# Patient Record
Sex: Male | Born: 1935
Health system: Southern US, Community
[De-identification: ages and names within clinical notes are randomized; demographics above are authoritative.]

## PROBLEM LIST (undated history)

## (undated) DIAGNOSIS — I639 Cerebral infarction, unspecified: Secondary | ICD-10-CM

## (undated) DIAGNOSIS — G629 Polyneuropathy, unspecified: Secondary | ICD-10-CM

## (undated) DIAGNOSIS — H9190 Unspecified hearing loss, unspecified ear: Secondary | ICD-10-CM

## (undated) DIAGNOSIS — K4090 Unilateral inguinal hernia, without obstruction or gangrene, not specified as recurrent: Secondary | ICD-10-CM

## (undated) DIAGNOSIS — E119 Type 2 diabetes mellitus without complications: Secondary | ICD-10-CM

## (undated) DIAGNOSIS — I10 Essential (primary) hypertension: Secondary | ICD-10-CM

## (undated) DIAGNOSIS — C4490 Unspecified malignant neoplasm of skin, unspecified: Secondary | ICD-10-CM

## (undated) DIAGNOSIS — I679 Cerebrovascular disease, unspecified: Secondary | ICD-10-CM

## (undated) DIAGNOSIS — N390 Urinary tract infection, site not specified: Secondary | ICD-10-CM

## (undated) DIAGNOSIS — G459 Transient cerebral ischemic attack, unspecified: Secondary | ICD-10-CM

## (undated) DIAGNOSIS — I4891 Unspecified atrial fibrillation: Secondary | ICD-10-CM

## (undated) HISTORY — DX: Unspecified malignant neoplasm of skin, unspecified: C44.90

## (undated) HISTORY — DX: Transient cerebral ischemic attack, unspecified: G45.9

## (undated) HISTORY — DX: Cerebral infarction, unspecified: I63.9

## (undated) HISTORY — DX: Urinary tract infection, site not specified: N39.0

## (undated) HISTORY — DX: Cerebrovascular disease, unspecified: I67.9

## (undated) HISTORY — DX: Unspecified hearing loss, unspecified ear: H91.90

## (undated) HISTORY — DX: Polyneuropathy, unspecified: G62.9

## (undated) HISTORY — DX: Unspecified atrial fibrillation: I48.91

## (undated) HISTORY — DX: Type 2 diabetes mellitus without complications: E11.9

## (undated) HISTORY — DX: Essential (primary) hypertension: I10

## (undated) HISTORY — PX: OTHER SURGICAL HISTORY: SHX169

## (undated) HISTORY — DX: Unilateral inguinal hernia, without obstruction or gangrene, not specified as recurrent: K40.90

---

## 1998-05-13 ENCOUNTER — Ambulatory Visit (HOSPITAL_COMMUNITY): Admission: RE | Admit: 1998-05-13 | Discharge: 1998-05-13 | Payer: Self-pay | Admitting: Internal Medicine

## 1998-10-05 ENCOUNTER — Encounter: Payer: Self-pay | Admitting: Internal Medicine

## 1998-10-05 ENCOUNTER — Ambulatory Visit (HOSPITAL_COMMUNITY): Admission: RE | Admit: 1998-10-05 | Discharge: 1998-10-05 | Payer: Self-pay | Admitting: Internal Medicine

## 2003-07-03 ENCOUNTER — Inpatient Hospital Stay (HOSPITAL_COMMUNITY): Admission: EM | Admit: 2003-07-03 | Discharge: 2003-07-09 | Payer: Self-pay | Admitting: Emergency Medicine

## 2003-07-04 ENCOUNTER — Encounter (INDEPENDENT_AMBULATORY_CARE_PROVIDER_SITE_OTHER): Payer: Self-pay | Admitting: *Deleted

## 2003-07-11 ENCOUNTER — Ambulatory Visit (HOSPITAL_COMMUNITY): Admission: RE | Admit: 2003-07-11 | Discharge: 2003-07-11 | Payer: Self-pay | Admitting: Neurology

## 2009-10-08 ENCOUNTER — Ambulatory Visit (HOSPITAL_COMMUNITY): Admission: RE | Admit: 2009-10-08 | Discharge: 2009-10-08 | Payer: Self-pay | Admitting: Gastroenterology

## 2010-04-06 ENCOUNTER — Inpatient Hospital Stay (HOSPITAL_COMMUNITY): Admission: EM | Admit: 2010-04-06 | Discharge: 2010-04-10 | Payer: Self-pay | Admitting: Emergency Medicine

## 2010-04-07 ENCOUNTER — Encounter (INDEPENDENT_AMBULATORY_CARE_PROVIDER_SITE_OTHER): Payer: Self-pay | Admitting: Neurology

## 2010-09-30 LAB — PROTIME-INR
INR: 1.69 — ABNORMAL HIGH (ref 0.00–1.49)
INR: 1.79 — ABNORMAL HIGH (ref 0.00–1.49)
INR: 2.78 — ABNORMAL HIGH (ref 0.00–1.49)
Prothrombin Time: 20.1 seconds — ABNORMAL HIGH (ref 11.6–15.2)
Prothrombin Time: 21 seconds — ABNORMAL HIGH (ref 11.6–15.2)
Prothrombin Time: 23.9 seconds — ABNORMAL HIGH (ref 11.6–15.2)

## 2010-09-30 LAB — COMPREHENSIVE METABOLIC PANEL
Alkaline Phosphatase: 64 U/L (ref 39–117)
BUN: 16 mg/dL (ref 6–23)
Glucose, Bld: 198 mg/dL — ABNORMAL HIGH (ref 70–99)
Potassium: 3.8 mEq/L (ref 3.5–5.1)
Total Protein: 6.8 g/dL (ref 6.0–8.3)

## 2010-09-30 LAB — URINALYSIS, ROUTINE W REFLEX MICROSCOPIC
Bilirubin Urine: NEGATIVE
Hgb urine dipstick: NEGATIVE
Ketones, ur: NEGATIVE mg/dL
Protein, ur: NEGATIVE mg/dL
Urobilinogen, UA: 0.2 mg/dL (ref 0.0–1.0)

## 2010-09-30 LAB — URINE CULTURE: Culture: NO GROWTH

## 2010-09-30 LAB — GLUCOSE, CAPILLARY
Glucose-Capillary: 144 mg/dL — ABNORMAL HIGH (ref 70–99)
Glucose-Capillary: 154 mg/dL — ABNORMAL HIGH (ref 70–99)
Glucose-Capillary: 164 mg/dL — ABNORMAL HIGH (ref 70–99)
Glucose-Capillary: 164 mg/dL — ABNORMAL HIGH (ref 70–99)
Glucose-Capillary: 189 mg/dL — ABNORMAL HIGH (ref 70–99)
Glucose-Capillary: 211 mg/dL — ABNORMAL HIGH (ref 70–99)
Glucose-Capillary: 243 mg/dL — ABNORMAL HIGH (ref 70–99)

## 2010-09-30 LAB — HEMOGLOBIN A1C
Hgb A1c MFr Bld: 6.9 % — ABNORMAL HIGH (ref <5.7)
Mean Plasma Glucose: 151 mg/dL — ABNORMAL HIGH (ref <117)

## 2010-09-30 LAB — LIPID PANEL: VLDL: 26 mg/dL (ref 0–40)

## 2010-09-30 LAB — CBC
HCT: 45.6 % (ref 39.0–52.0)
MCHC: 36 g/dL (ref 30.0–36.0)
RDW: 13.1 % (ref 11.5–15.5)

## 2010-09-30 LAB — DIFFERENTIAL
Basophils Absolute: 0 10*3/uL (ref 0.0–0.1)
Basophils Relative: 0 % (ref 0–1)
Monocytes Relative: 8 % (ref 3–12)
Neutro Abs: 5.3 10*3/uL (ref 1.7–7.7)
Neutrophils Relative %: 63 % (ref 43–77)

## 2010-09-30 LAB — CK TOTAL AND CKMB (NOT AT ARMC)
Relative Index: INVALID (ref 0.0–2.5)
Total CK: 50 U/L (ref 7–232)

## 2010-09-30 LAB — APTT: aPTT: 34 seconds (ref 24–37)

## 2010-10-07 ENCOUNTER — Inpatient Hospital Stay (HOSPITAL_COMMUNITY): Payer: Medicare Other

## 2010-10-07 ENCOUNTER — Inpatient Hospital Stay (HOSPITAL_COMMUNITY)
Admission: EM | Admit: 2010-10-07 | Discharge: 2010-10-08 | DRG: 069 | Disposition: A | Discharge status: Home / Self Care | Payer: Medicare Other | Attending: Internal Medicine | Admitting: Internal Medicine

## 2010-10-07 ENCOUNTER — Emergency Department (HOSPITAL_COMMUNITY): Payer: Medicare Other

## 2010-10-07 DIAGNOSIS — E119 Type 2 diabetes mellitus without complications: Secondary | ICD-10-CM | POA: Diagnosis present

## 2010-10-07 DIAGNOSIS — E785 Hyperlipidemia, unspecified: Secondary | ICD-10-CM | POA: Diagnosis present

## 2010-10-07 DIAGNOSIS — Z8673 Personal history of transient ischemic attack (TIA), and cerebral infarction without residual deficits: Secondary | ICD-10-CM

## 2010-10-07 DIAGNOSIS — Z79899 Other long term (current) drug therapy: Secondary | ICD-10-CM

## 2010-10-07 DIAGNOSIS — I10 Essential (primary) hypertension: Secondary | ICD-10-CM | POA: Diagnosis present

## 2010-10-07 DIAGNOSIS — I4891 Unspecified atrial fibrillation: Secondary | ICD-10-CM | POA: Diagnosis present

## 2010-10-07 DIAGNOSIS — Z7901 Long term (current) use of anticoagulants: Secondary | ICD-10-CM

## 2010-10-07 DIAGNOSIS — G459 Transient cerebral ischemic attack, unspecified: Principal | ICD-10-CM | POA: Diagnosis present

## 2010-10-07 LAB — DIFFERENTIAL
Basophils Absolute: 0 10*3/uL (ref 0.0–0.1)
Eosinophils Absolute: 0.3 10*3/uL (ref 0.0–0.7)
Lymphocytes Relative: 39 % (ref 12–46)
Lymphs Abs: 3.2 10*3/uL (ref 0.7–4.0)
Neutrophils Relative %: 48 % (ref 43–77)

## 2010-10-07 LAB — CARDIAC PANEL(CRET KIN+CKTOT+MB+TROPI)
CK, MB: 1 ng/mL (ref 0.3–4.0)
Relative Index: INVALID (ref 0.0–2.5)
Total CK: 49 U/L (ref 7–232)
Troponin I: <0.01 ng/mL (ref 0.00–0.06)

## 2010-10-07 LAB — CBC
HCT: 43.7 % (ref 39.0–52.0)
MCH: 30.6 pg (ref 26.0–34.0)
MCHC: 36.5 g/dL — ABNORMAL HIGH (ref 30.0–36.0)
MCV: 84.2 fL (ref 78.0–100.0)
Platelets: 178 10*3/uL (ref 150–400)
Platelets: 193 10*3/uL (ref 150–400)
RBC: 5.19 MIL/uL (ref 4.22–5.81)
RBC: 4.97 MIL/uL (ref 4.22–5.81)
RDW: 12.8 % (ref 11.5–15.5)
WBC: 8.2 10*3/uL (ref 4.0–10.5)

## 2010-10-07 LAB — COMPREHENSIVE METABOLIC PANEL
Albumin: 3.9 g/dL (ref 3.5–5.2)
BUN: 18 mg/dL (ref 6–23)
Calcium: 9.3 mg/dL (ref 8.4–10.5)
Creatinine, Ser: 1.03 mg/dL (ref 0.4–1.5)
Glucose, Bld: 202 mg/dL — ABNORMAL HIGH (ref 70–99)
Potassium: 3.7 mEq/L (ref 3.5–5.1)
Total Protein: 6.8 g/dL (ref 6.0–8.3)

## 2010-10-07 LAB — GLUCOSE, CAPILLARY: Glucose-Capillary: 138 mg/dL — ABNORMAL HIGH (ref 70–99)

## 2010-10-07 LAB — HEMOGLOBIN A1C: Hgb A1c MFr Bld: 6.8 % — ABNORMAL HIGH (ref <5.7)

## 2010-10-07 LAB — PROTIME-INR: INR: 1.97 — ABNORMAL HIGH (ref 0.00–1.49)

## 2010-10-07 LAB — POCT I-STAT, CHEM 8
Creatinine, Ser: 1.1 mg/dL (ref 0.4–1.5)
Glucose, Bld: 119 mg/dL — ABNORMAL HIGH (ref 70–99)
Hemoglobin: 15.6 g/dL (ref 13.0–17.0)
TCO2: 25 mmol/L (ref 0–100)

## 2010-10-07 LAB — APTT: aPTT: 36 seconds (ref 24–37)

## 2010-10-07 LAB — POCT CARDIAC MARKERS
CKMB, poc: <1 ng/mL — ABNORMAL LOW (ref 1.0–8.0)
Myoglobin, poc: 62.5 ng/mL (ref 12–200)
Troponin i, poc: <0.05 ng/mL (ref 0.00–0.09)

## 2010-10-08 LAB — GLUCOSE, CAPILLARY: Glucose-Capillary: 118 mg/dL — ABNORMAL HIGH (ref 70–99)

## 2010-10-08 LAB — LIPID PANEL
HDL: 40 mg/dL (ref >39)
Total CHOL/HDL Ratio: 2.5 RATIO
Triglycerides: 59 mg/dL (ref <150)

## 2010-10-14 NOTE — H&P (Signed)
Derrick Ross, Derrick Ross NO.:  [id number]  MEDICAL RECORD NO.:  [id number]           PATIENT TYPE:  E  LOCATION:  [facility]                         FACILITY:  [facility]  PHYSICIAN:  Michiel Cowboy, MDDATE OF BIRTH:  1930/08/08  DATE OF ADMISSION:  10/07/2010 DATE OF DISCHARGE:                             HISTORY & PHYSICAL   PRIMARY CARE PROVIDER:  [name], MD  CHIEF COMPLAINT:  Altered mental status.  HISTORY:  The patient is a 77 year old gentleman with past history of CVA, atrial fibrillation, currently on Coumadin and history of TIA in September.  The patient today went to bed at 12.  He woke up at 4 o'clock in the morning to use the bathroom.  His wife was awake already. She noticed that he was making some rumbling noises, she came up to see him.  He was talking to her, but did not seem to make much sense.  She started to ask him questions of general orientation, but he did not know what year it is, what date it was, only orientation questions.  The patient's wife became concerned and got him dressed up, walk him down the stairs, and drove him to the emergency department.  By the time he came to the ER, he was back to his baseline, was answering questions appropriately, he knew where he was.  He never had any ataxia.  His speech was somewhat slurred and also was somewhat confusing during this episode.  Overall, the episode lasted 30 minutes.  Currently, the patient is at baseline.  No other recent complaints of chest pain or shortness of breath.  No nausea, vomiting, constipation, or diarrhea.  PAST MEDICAL HISTORY:  Significant for: 1. Atrial fibrillation. 2. History of cerebrovascular accident 6 years ago/ 3. History of TIA in September. 4. Hypertension. 5. Most recently, he was diagnosed with diabetes, at this point diet     controlled, not on any medication.  SOCIAL HISTORY:  The patient lives with his wife.  He does not smoke or drink or use  drugs.  FAMILY HISTORY:  Noncontributory.  ALLERGIES:  None.  MEDICATIONS: 1. Atenolol 100 mg daily. 2. Clonidine 0.1 mg b.i.d. 3. Coumadin he takes 2.5 mg on all days except Tuesdays when he takes     5 mg. 4. Hydrochlorothiazide 25 mg daily. 5. Lotrel 5/40 mg daily. 6. Simvastatin 20 mg daily. 7. Potassium 20 mEq daily.  PHYSICAL EXAMINATION:  VITAL SIGNS:  Temperature 98.0, blood pressure 143/80, pulse 65, respirations 18, satting 96% on room air. GENERAL:  The patient appears to be in no acute distress. HEAD:  Nontraumatic.  Moist mucous membranes. LUNGS:  Clear to auscultation bilaterally. HEART:  Slightly irregular, but no murmurs appreciated. ABDOMEN:  Soft, nontender, nondistended. EXTREMITIES:  Lower extremities 1+ edema bilaterally, which the patient states has been chronic and thought to be related to his blood pressure medication. NEUROLOGIC:  Cranial nerves II through XII intact.  Strength 5/5 in all four extremities.  Otherwise, neurologically intact. SKIN:  Clean, dry, and intact.  There are few freckles on his back, but otherwise unremarkable.  LABORATORY DATA:  White blood cell count 8.2, hemoglobin 15.6.  Sodium 139, potassium 3.4, chloride 101.  Cardiac enzymes negative.  EKG showing AFib with no ischemia.  INR 1.97.  CT scan negative except for chronic ischemic changes and remote right frontal CVA.  ASSESSMENT AND PLAN: 1. This is a 77 year old gentleman with question of repeat transient     ischemic attack versus he was confused when awakened at night.     Right now, we will admit, do transient ischemic attack workup     including MRI, MRA, 2-D echo with Dopplers, consider Neurology     consult to see if they have any further suggestions.  He is already     on Coumadin with slightly subtherapeutic INR at 1.97.  Neurology     consult, consider in the morning.  Right now continue with aspirin     and Coumadin. 2. Hypertension:  Continue home meds;  atenolol, Lotrel, clonidine. 3. Diabetes:  Put on sliding scale and monitor. 4. Hyperlipidemia:  Continue simvastatin. 5. Atrial fibrillation:  Watch him on telemetry, currently rate     controlled on atenolol, repeat echogram. 6. Prophylaxis:  Protonix.  He is already on Coumadin. 7. Code status:  The patient wished to be full code as confirmed with     him and his wife.     Michiel Cowboy, MD     AVD/MEDQ  D:  10/07/2010  T:  10/07/2010  Job:  [id number]  cc:   Derrick Ross, M.D.  Electronically Signed by Therisa Doyne MD on 10/14/2010 02:39:32 AM

## 2010-10-19 NOTE — Discharge Summary (Signed)
  [name], Derrick Ross NO.:  [id number]  MEDICAL RECORD NO.:  [id number]           PATIENT TYPE:  I  LOCATION:  3001                         FACILITY:  [facility]  PHYSICIAN:  [name], M.D.    DATE OF BIRTH:  1931/04/19  DATE OF ADMISSION:  10/07/2010 DATE OF DISCHARGE:  10/08/2010                              DISCHARGE SUMMARY   ADMITTING DIAGNOSIS:  Transient ischemic attack.  DISCHARGE DIAGNOSES: 1. Transient ischemic attack with dysphasia and mild confusion. 2. Atrial fibrillation. 3. History of cerebrovascular accident in about 2006. 4. History of transient ischemic attack in September 2011. 5. Hypertension. 6. Diet-controlled diabetes.  The patient is a 77 year old white male who has a prior history of a TIA with thorough workup in the fall of 2011.  He awoke with evidence of TIA and was brought to the emergency department by his wife.  HOSPITAL COURSE:  The patient was admitted and all of his symptoms had resolved by the time he was admitted.  Neuro checks revealed no significant findings over the course of the hospitalization.  Everything resolved.  He had a CT scan which showed no hemorrhage, and an MRI that showed no new evidence of stroke and so it was concluded that the patient did suffer from a TIA.  He was observed for 36 hours and had no further neurologic events and was discharged in improved condition.  There were no changes in his discharge medications.  He continues on: 1. Acetaminophen 500 mg two tablets q.6 h. p.r.n. 2. Amlodipine 5 mg daily. 3. Atenolol 100 mg daily. 4. Benazepril 40 mg daily. 5. Clonidine 0.2 mg twice daily. 6. Warfarin 5 mg one-half tablet every day except Tuesdays when he     takes a whole tablet. 7. Multivitamin daily. 8. Hydrochlorothiazide 25 mg two tablets daily. 9. Potassium chloride 20 mEq one tablet daily. 10.Ranitidine 150 mg daily. 11.Simvastatin 20 mg daily.  FOLLOWUP:  The patient will keep his  prior appointment in our office which is dated Nov 17, 2009.  ACTIVITIES:  As tolerated.  DIET:  Modified carbohydrate, no added salt.     [name], M.D.     JO/MEDQ  D:  10/14/2010  T:  10/14/2010  Job:  [id number]  Electronically Signed by [name] M.D. on 10/19/2010 08:09:26 AM

## 2010-12-03 NOTE — Consult Note (Signed)
NAMEHASKELL, RIHN NO.:  [id number]   MEDICAL RECORD NO.:  [id number]                   PATIENT TYPE:  INP   LOCATION:  3032                                 FACILITY:  [facility]   PHYSICIAN:  Meade Maw, M.D.                 DATE OF BIRTH:  03/09/1931   DATE OF CONSULTATION:  DATE OF DISCHARGE:                                   CONSULTATION   INDICATION FOR CONSULTATION:  Atrial fibrillation, evaluation for initiation  of Coumadin.   Mr. Lorene Samaan is a very pleasant 77 year old gentleman who has a history  of mild hypertension and questionably paroxysmal atrial fibrillation. He was  admitted on December 16th following some left arm clumsiness.  He walked  into the house, took a shower with an open shower stall and because of his  wife's concern he was brought to the emergency room by private car. Chanetta Marshall  relates that he has had palpitations and racing heart since 1998. He  presented to Dr. Newell Coral office in 1999 and at that time reportedly had a  fast heart rate and was allowed to be discharged to home on atenolol. A  Holter monitor was performed at that time. The patient is somewhat sketchy  as to whether this actually revealed atrial fibrillation or was presumed to  be atrial fibrillation to account for his symptoms of palpitations. He has  had no presyncope, no syncope, no chest pain.   REVIEW OF SYSTEMS:  He notes tingling and numbness in his left arm, but not  face or leg.  The patient notes a couple of days prior to this event he had  felt his irregular heart rate and felt that he was again having short runs  of atrial fibrillation.   PAST MEDICAL HISTORY:  Significant for as noted above.  1. Hypertension.  2. Questionable paroxysmal atrial fibrillation.   MEDICATIONS PRIOR TO ADMISSION:  1. Aspirin 325 mg daily.  2. Atenolol 50 mg daily.  3. Accupril 40 mg daily.  4. Hydrochlorothiazide 12.5 mg daily.   ALLERGIES:  He has no  known drug allergies.   FAMILY HISTORY:  Negative.   SOCIAL HISTORY:  He lives with his wife.  There is no history of alcohol,  tobacco, or illicit drug use.   PHYSICAL EXAMINATION:  GENERAL: Physical exam reveals an elderly male in no  acute distress. He looks younger than his stated age. Orlene Erm is  appropriate.  HEENT:  Unremarkable. He has good carotid upstrokes.  NECK: There is no neck vein distention noted.  PULMONARY: Breath sounds are equal and clear to auscultation.  CARDIAC: Regular rate and rhythm; normal S1 and S2. No murmurs, rubs, or  gallops noted.  ABDOMEN: Soft, benign, and nontender.  No unusual bruits or pulsations are  noted.  EXTREMITIES: No peripheral edema.  SKIN: Warm and dry.  NEUROLOGIC: Nonfocal.   LABORATORY DATA:  ECG  was obtained and was technically difficult; however,  as ejection fraction was noted to be 55% to 65%, he had normal valvular  morphology. The right ventricle was not well visualized. The right atrium  was noted to be normal in size. The left atrium was also noted to be normal  in size.   His CBC reveals a white count of 8.7, hematocrit 40.2, platelet count  217,000.  Glucose 142, creatinine 1.1. Normal liver enzymes.  Potassium was  3.2 on admission.  Triglycerides was 110, total cholesterol 156, LDL 98, and  HDL 36.   ECG reveals a sinus rhythm, nonspecific ST changes. Telemetry has revealed a  normal sinus rhythm. There have been no arrhythmias noted.  Chest x-ray  reveals mild cardiac enlargement and COPD. MRI of the brain reveals an acute  infarct in the right frontal lobe. Carotid duplex studies were normal.   IMPRESSION AND PLAN:  75. A 77 year old gentleman with right frontal infarct while on aspirin.  The     history is somewhat sketchy for atrial fibrillation; however, at this     time would continue with Coumadin.  Will need to obtain the office     records to determine if atrial fibrillation has ever been documented  or     if this was a presumed diagnosis.  If it has been documented, the patient     will need lifelong anticoagulation. If he is to remain on Coumadin, would     do a transthoracic echo for further evaluation of possible ASD or PFO.     This is unlikely in that his right and left atrial size is within normal     limits.  If he has no demonstrated paroxysmal atrial fibrillation, would     proceed with a transesophageal echo for further evaluation for possible     aortic disease as well as cardiac disease.  2. Hypokalemia. Will replace potassium and repeat a BMET.  3. Hypertension. Blood pressure is adequately controlled.                                               Meade Maw, M.D.    HP/MEDQ  D:  07/05/2003  T:  07/06/2003  Job:  [id number]   cc:   Genene Churn. Love, M.D.  [address]  [address]  [place]  [place] [address]  Fax: [phone]   Lyn Records III, M.D.  [address]  [address]  [place]  [place] [address]  Fax: [phone]   [name], M.D.  [address] [place]  [place] [address]  Fax: [phone]

## 2010-12-03 NOTE — H&P (Signed)
Derrick Ross, Derrick Ross NO.:  [id number]   MEDICAL RECORD NO.:  [id number]                   PATIENT TYPE:  EMS   LOCATION:  [facility]                                 FACILITY:  [facility]   PHYSICIAN:  Melvyn Novas, M.D.               DATE OF BIRTH:  02-24-1931   DATE OF ADMISSION:  07/03/2003  DATE OF DISCHARGE:                                HISTORY & PHYSICAL   HISTORY OF PRESENT ILLNESS:  This 77 year old right-handed Caucasian  gentleman has a history of mild hypertension and atrial fibrillation.  He is  a bit afferent.  He presented at his home with a sudden onset of left arm  clumsiness.  He actually favored his right arm and was not really aware that  he did not use his left arm when he was working in his front yard.  His wife  noticed a change at about 11:30 and also found that his left hand was  clumsy.  The patient walked fine into the house, took a shower with an open  door of the shower stall, indicating he might have had apraxia or confusion.  The patient's spouse then called Dr. Earl Gala, their primary care physician,  who recommended him to be seen in the emergency room.  They arrived by  private car at 12:45.   REVIEW OF SYSTEMS:  Tingling, numbness in the left arm, not face, not leg.  MENTAL STATUS:  Feeling funny, but also able to converse fully.  Alert and  oriented, follows commands even multiple step commands.  Shows no apraxia,  __________.  Cranial nerve examination:  Pupils are equal.  Extraocular  movements are intact.  Full visual fields bilaterally to stimulation.  The  patient shows no facial asymmetry, no facial sensory loss.  Tongue and uvula  midline.  Gag is intact.  Motor examination shows bilaterally fully extends  and good strength for the proximal motor groups and arms and legs.  There is  only left wrist flexion weakness which I would rate at 4/5 and the grip  strength might be mildly weaker on the left, but there is no  pronator drift.  The patient's gait again, was intact.  Deep tendon reflexes were equal.  No  upgoing toe on tactile stimulation.  Sensory shows equal sensory for  vibration, touch and temperature, except in the area of the left hand where  the patient states that he feels that his hand has fallen asleep.  Coordination-dysmetria, left override, mild.  No tremor, no limb ataxia on  the whole.  The patient's symptoms seem to progressively resolve during the  examination.  The gait is stable.  The patient is able to walk unassisted.  Uses a urinal.   PAST MEDICAL HISTORY:  Hypertension , atrial fibrillation.   MEDICATIONS:  1. Aspirin 325 mg a half a day.  2. Atenolol 50 mg a day.  3. Accupril 40  mg a day.  4. Hydrochlorothiazide a half of 25 mg a day.   ALLERGIES:  No known drug allergies.   FAMILY HISTORY:  Negative.   SOCIAL HISTORY:  Lives with his wife, nonsmoker, nondrinker, retired.   ASSESSMENT:  Transient ischemic attack or perhaps small lacunar stroke,  sensory only.  No significant presentation.  CT negative for bleed or  edematous changes and ischemic stroke.  The deficit is slowly resolving and  the patient did not show enough motor deficit, especially to qualify for an  intra-arterial or IV clot buster.   PLAN:  Plan is to admit the patient on a regular stroke protocol.  Because  of his atrial fibrillation history, we will start him on IV heparin vena  bolus.  No atrial fibrillation on EKG was seen here, however.  Blood  pressure is currently 190/90 and we will not treat this.  Heart rate is 80  and regular.  CMET is within normal limits.  CBC return is within normal  limits.   The case is discussed with the stroke doctor, Dr. Dorcas Mcmurray, on-call.                                                Melvyn Novas, M.D.    CD/MEDQ  D:  07/03/2003  T:  07/03/2003  Job:  [id number]

## 2010-12-03 NOTE — Discharge Summary (Signed)
[name], [name]                           ACCOUNT NO.:  [id number]   MEDICAL RECORD NO.:  [id number]                   PATIENT TYPE:  INP   LOCATION:  3032                                 FACILITY:  [facility]   PHYSICIAN:  Pramod P. Pearlean Brownie, MD                 DATE OF BIRTH:  1930-10-15   DATE OF ADMISSION:  07/03/2003  DATE OF DISCHARGE:  07/09/2003                                 DISCHARGE SUMMARY   DISCHARGE DIAGNOSES:  1. Acute right frontal infarction.  2. History of paroxysmal atrial fibrillation documented via Holter monitor     1999.  3. Refractory hypertension.   DISCHARGE MEDICATIONS:  1. Accupril 40 mg daily.  2. Coumadin 5 mg daily starting December 23.  Coumadin 2.5 mg December 22.  3. Hydrochlorothiazide 25 mg daily.  4. K-Dur 20 mEq b.i.d.  5. Clonidine 0.1 mg b.i.d.  6. Tenormin 75 mg daily.   STUDIES PERFORMED:  1. CT on admission:  Hypodensity in the periventricular white matter with     chronic ischemic changes, small vessel disease.  Lacune are seen in the     bilateral basal ganglia and right external capsule.  No acute     intracranial pathology.  2. Chest x-ray shows no infiltrate or effusion.  Does have cardiac     enlargement and COPD.  3. MRI:  Acute right parietal infarct with moderate to advanced chronic     small vessel disease.  4. MRA of the brain shows patent anterior cerebral arteries with mild     intracranial atherosclerotic disease.  5. 2-D echocardiogram shows EF of 55-65%.  Unstable to evaluate LV wall     motion abnormalities.  No obvious cardiac source, though cannot be ruled     out on the basis of this study.  6. Carotid Doppler shows no significant ICA stenosis with vertebral flow     antegrade.  There is some ECA stenosis on the right.  7. Transcranial Doppler pending at time of discharge.   LABORATORY STUDIES:  Hemoglobin 15.3, hematocrit 43.3, white blood cells  8.8, platelets 232,000.  INR 1.9 day of discharge.  Chemistry:   Sodium 128,  potassium 3.6, chloride 109, CO2 24, BUN 12, creatinine 0.9, glucose 123.  Hemoglobin A1C 5.7.  Cholesterol 156, triglycerides 110, HDL 36, LDL 98.  Homocysteine 10.59.   HISTORY OF PRESENT ILLNESS:  Derrick Ross is a 77 year old right-handed  white male with a history of hypertension and questionable atrial  fibrillation.  He was at home when he noticed sudden onset of left arm  clumsiness while mowing the yard.  He was not really aware that he did not  use his left arm but his wife noticed about 11:30 and found his hand clumsy.  The patient took a shower with the door open on the shower stall indicating  he may have some apraxia or confusion.  The  patient's wife called Dr.  Earl Gala who referred him to the emergency room.  He was seen in the  emergency room.  CT was negative for acute infarct or hemorrhage.  He was  not a TPA candidate secondary to quick resolution of symptoms and not a [place] candidate for the same reason.  He was admitted to the hospital for  further stroke work-up.   HOSPITAL COURSE:  MRI did reveal an acute infarct on the right frontal lobe.  His left arm weakness improved during hospitalization with only minimal, if  no, mild fine motor weakness on the left.  Cardiology consult was obtained.  Dr. Katrinka Blazing had seen him in the past.  He did have a Holter monitor in 1999  which had documented paroxysmal atrial fibrillation.  With paroxysmal atrial  fibrillation documented, will not do a TEE and will place patient on  Coumadin.   DISCHARGE PLAN:  1. Discharged home with wife.  2. Coumadin for secondary stroke prevention.  With rapid rise in INR, will     check on July 11, 2003.  Coumadin adjustment by [facility] over the weekend     with Dr. Earl Gala resuming primarily responsibility on Monday, July 14, 2003.  3. Follow up with Dr. Earl Gala in one month.  Follow up with Dr. Pearlean Brownie in one     month.      Annie Main, N.P.                          Pramod P. Pearlean Brownie, MD    SB/MEDQ  D:  07/09/2003  T:  07/11/2003  Job:  [id number]   cc:   [name], M.D.  [address] [place]  [place] [address]  Fax: [phone]   Lyn Records III, M.D.  [address]  [address]  [place]  [place] [address]  Fax: [phone]

## 2014-03-07 ENCOUNTER — Encounter: Payer: Self-pay | Admitting: *Deleted

## 2014-09-26 ENCOUNTER — Encounter (HOSPITAL_COMMUNITY): Payer: Self-pay

## 2014-09-26 ENCOUNTER — Inpatient Hospital Stay (HOSPITAL_COMMUNITY): Payer: Medicare Other

## 2014-09-26 ENCOUNTER — Observation Stay (HOSPITAL_COMMUNITY)
Admission: EM | Admit: 2014-09-26 | Discharge: 2014-09-27 | Disposition: A | Discharge status: Home / Self Care | Payer: Medicare Other | Attending: Internal Medicine | Admitting: Internal Medicine

## 2014-09-26 ENCOUNTER — Emergency Department (HOSPITAL_COMMUNITY): Payer: Medicare Other

## 2014-09-26 DIAGNOSIS — I10 Essential (primary) hypertension: Secondary | ICD-10-CM | POA: Diagnosis present

## 2014-09-26 DIAGNOSIS — N183 Chronic kidney disease, stage 3 (moderate): Secondary | ICD-10-CM | POA: Diagnosis not present

## 2014-09-26 DIAGNOSIS — K219 Gastro-esophageal reflux disease without esophagitis: Secondary | ICD-10-CM | POA: Insufficient documentation

## 2014-09-26 DIAGNOSIS — E785 Hyperlipidemia, unspecified: Secondary | ICD-10-CM | POA: Insufficient documentation

## 2014-09-26 DIAGNOSIS — I4891 Unspecified atrial fibrillation: Secondary | ICD-10-CM

## 2014-09-26 DIAGNOSIS — E119 Type 2 diabetes mellitus without complications: Secondary | ICD-10-CM | POA: Diagnosis not present

## 2014-09-26 DIAGNOSIS — Z7982 Long term (current) use of aspirin: Secondary | ICD-10-CM | POA: Diagnosis not present

## 2014-09-26 DIAGNOSIS — I639 Cerebral infarction, unspecified: Secondary | ICD-10-CM | POA: Diagnosis present

## 2014-09-26 DIAGNOSIS — Z7901 Long term (current) use of anticoagulants: Secondary | ICD-10-CM | POA: Diagnosis not present

## 2014-09-26 DIAGNOSIS — G459 Transient cerebral ischemic attack, unspecified: Principal | ICD-10-CM

## 2014-09-26 DIAGNOSIS — Z8673 Personal history of transient ischemic attack (TIA), and cerebral infarction without residual deficits: Secondary | ICD-10-CM | POA: Insufficient documentation

## 2014-09-26 DIAGNOSIS — I482 Chronic atrial fibrillation: Secondary | ICD-10-CM | POA: Insufficient documentation

## 2014-09-26 DIAGNOSIS — I129 Hypertensive chronic kidney disease with stage 1 through stage 4 chronic kidney disease, or unspecified chronic kidney disease: Secondary | ICD-10-CM | POA: Insufficient documentation

## 2014-09-26 DIAGNOSIS — R410 Disorientation, unspecified: Secondary | ICD-10-CM | POA: Diagnosis present

## 2014-09-26 DIAGNOSIS — G9340 Encephalopathy, unspecified: Secondary | ICD-10-CM | POA: Diagnosis present

## 2014-09-26 LAB — CBG MONITORING, ED: GLUCOSE-CAPILLARY: 143 mg/dL — AB (ref 70–99)

## 2014-09-26 LAB — I-STAT CHEM 8, ED
BUN: 20 mg/dL (ref 6–23)
CREATININE: 1 mg/dL (ref 0.50–1.35)
Calcium, Ion: 1.2 mmol/L (ref 1.13–1.30)
Chloride: 101 mmol/L (ref 96–112)
Glucose, Bld: 226 mg/dL — ABNORMAL HIGH (ref 70–99)
HCT: 48 % (ref 39.0–52.0)
Hemoglobin: 16.3 g/dL (ref 13.0–17.0)
Potassium: 3.4 mmol/L — ABNORMAL LOW (ref 3.5–5.1)
Sodium: 139 mmol/L (ref 135–145)
TCO2: 21 mmol/L (ref 0–100)

## 2014-09-26 LAB — COMPREHENSIVE METABOLIC PANEL
ALT: 14 U/L (ref 0–53)
ANION GAP: 12 (ref 5–15)
AST: 22 U/L (ref 0–37)
Albumin: 3.9 g/dL (ref 3.5–5.2)
Alkaline Phosphatase: 66 U/L (ref 39–117)
BUN: 17 mg/dL (ref 6–23)
CHLORIDE: 104 mmol/L (ref 96–112)
CO2: 23 mmol/L (ref 19–32)
Calcium: 9.8 mg/dL (ref 8.4–10.5)
Creatinine, Ser: 1.13 mg/dL (ref 0.50–1.35)
GFR calc Af Amer: 68 mL/min — ABNORMAL LOW (ref >90)
GFR, EST NON AFRICAN AMERICAN: 59 mL/min — AB (ref >90)
GLUCOSE: 218 mg/dL — AB (ref 70–99)
Potassium: 3.4 mmol/L — ABNORMAL LOW (ref 3.5–5.1)
Sodium: 139 mmol/L (ref 135–145)
Total Bilirubin: 1.1 mg/dL (ref 0.3–1.2)
Total Protein: 7 g/dL (ref 6.0–8.3)

## 2014-09-26 LAB — URINALYSIS, ROUTINE W REFLEX MICROSCOPIC
Bilirubin Urine: NEGATIVE
GLUCOSE, UA: NEGATIVE mg/dL
Hgb urine dipstick: NEGATIVE
KETONES UR: 15 mg/dL — AB
LEUKOCYTES UA: NEGATIVE
NITRITE: NEGATIVE
PH: 6 (ref 5.0–8.0)
Protein, ur: NEGATIVE mg/dL
SPECIFIC GRAVITY, URINE: 1.024 (ref 1.005–1.030)
Urobilinogen, UA: 1 mg/dL (ref 0.0–1.0)

## 2014-09-26 LAB — CBC
HCT: 45.5 % (ref 39.0–52.0)
HEMOGLOBIN: 16.1 g/dL (ref 13.0–17.0)
MCH: 30.7 pg (ref 26.0–34.0)
MCHC: 35.4 g/dL (ref 30.0–36.0)
MCV: 86.7 fL (ref 78.0–100.0)
Platelets: 212 10*3/uL (ref 150–400)
RBC: 5.25 MIL/uL (ref 4.22–5.81)
RDW: 13 % (ref 11.5–15.5)
WBC: 8.6 10*3/uL (ref 4.0–10.5)

## 2014-09-26 LAB — DIFFERENTIAL
Basophils Absolute: 0 10*3/uL (ref 0.0–0.1)
Basophils Relative: 0 % (ref 0–1)
EOS ABS: 0.1 10*3/uL (ref 0.0–0.7)
Eosinophils Relative: 1 % (ref 0–5)
LYMPHS ABS: 1.5 10*3/uL (ref 0.7–4.0)
Lymphocytes Relative: 17 % (ref 12–46)
MONOS PCT: 6 % (ref 3–12)
Monocytes Absolute: 0.5 10*3/uL (ref 0.1–1.0)
Neutro Abs: 6.5 10*3/uL (ref 1.7–7.7)
Neutrophils Relative %: 76 % (ref 43–77)

## 2014-09-26 LAB — I-STAT TROPONIN, ED: TROPONIN I, POC: 0 ng/mL (ref 0.00–0.08)

## 2014-09-26 LAB — PROTIME-INR
INR: 1.14 (ref 0.00–1.49)
PROTHROMBIN TIME: 14.7 s (ref 11.6–15.2)

## 2014-09-26 LAB — APTT: aPTT: 30 seconds (ref 24–37)

## 2014-09-26 MED ORDER — ATENOLOL 100 MG PO TABS
100.0000 mg | ORAL_TABLET | Freq: Every day | ORAL | Status: DC
  Administered 2014-09-26 – 2014-09-27 (×2): 100 mg via ORAL
  Filled 2014-09-26 (×2): qty 1

## 2014-09-26 MED ORDER — SENNOSIDES-DOCUSATE SODIUM 8.6-50 MG PO TABS
1.0000 | ORAL_TABLET | Freq: Every evening | ORAL | Status: DC | PRN
Start: 2014-09-26 — End: 2014-09-27

## 2014-09-26 MED ORDER — AMLODIPINE BESYLATE 5 MG PO TABS
5.0000 mg | ORAL_TABLET | Freq: Every day | ORAL | Status: DC
  Administered 2014-09-27: 5 mg via ORAL
  Filled 2014-09-26: qty 1

## 2014-09-26 MED ORDER — POTASSIUM CHLORIDE CRYS ER 20 MEQ PO TBCR
20.0000 meq | EXTENDED_RELEASE_TABLET | Freq: Two times a day (BID) | ORAL | Status: DC
  Administered 2014-09-26 – 2014-09-27 (×2): 20 meq via ORAL
  Filled 2014-09-26 (×2): qty 1

## 2014-09-26 MED ORDER — WARFARIN SODIUM 4 MG PO TABS
4.0000 mg | ORAL_TABLET | Freq: Once | ORAL | Status: DC
  Filled 2014-09-26: qty 1

## 2014-09-26 MED ORDER — ENOXAPARIN SODIUM 100 MG/ML ~~LOC~~ SOLN
90.0000 mg | Freq: Two times a day (BID) | SUBCUTANEOUS | Status: DC
  Administered 2014-09-27: 90 mg via SUBCUTANEOUS
  Filled 2014-09-26: qty 1

## 2014-09-26 MED ORDER — STROKE: EARLY STAGES OF RECOVERY BOOK: Freq: Once | Status: DC

## 2014-09-26 MED ORDER — HYDROCHLOROTHIAZIDE 25 MG PO TABS
50.0000 mg | ORAL_TABLET | Freq: Every day | ORAL | Status: DC
  Administered 2014-09-27: 50 mg via ORAL
  Filled 2014-09-26: qty 2

## 2014-09-26 MED ORDER — BENAZEPRIL HCL 20 MG PO TABS
40.0000 mg | ORAL_TABLET | Freq: Every day | ORAL | Status: DC
  Administered 2014-09-27: 40 mg via ORAL
  Filled 2014-09-26 (×2): qty 2

## 2014-09-26 MED ORDER — CLONIDINE HCL 0.1 MG PO TABS
0.2000 mg | ORAL_TABLET | Freq: Two times a day (BID) | ORAL | Status: DC
  Administered 2014-09-26 – 2014-09-27 (×2): 0.2 mg via ORAL
  Filled 2014-09-26 (×2): qty 2

## 2014-09-26 MED ORDER — ASPIRIN EC 81 MG PO TBEC
81.0000 mg | DELAYED_RELEASE_TABLET | Freq: Every day | ORAL | Status: DC
Start: 2014-09-27 — End: 2014-09-27
  Administered 2014-09-27: 81 mg via ORAL
  Filled 2014-09-26 (×2): qty 1

## 2014-09-26 MED ORDER — ACETAMINOPHEN 500 MG PO TABS: 500.0000 mg | ORAL_TABLET | Freq: Four times a day (QID) | ORAL | Status: DC | PRN

## 2014-09-26 MED ORDER — SIMVASTATIN 20 MG PO TABS
20.0000 mg | ORAL_TABLET | Freq: Every day | ORAL | Status: DC
  Administered 2014-09-27: 20 mg via ORAL
  Filled 2014-09-26: qty 1

## 2014-09-26 MED ORDER — WARFARIN - PHARMACIST DOSING INPATIENT: Freq: Every day | Status: DC

## 2014-09-26 NOTE — Consult Note (Signed)
Referring Physician: Horton    Chief Complaint: confusion  HPI:                                                                                                                                         Derrick Ross is an 81 y.o. male who has Afib on coumadin with recent INR 1.17 today.  Last night wife noted he seemed confused but did not seek attention.  Over the night she noted he was also having "sleep apnea like symptoms."  He awoke this morning and continued to not seem to be himself--having difficulty finding words, picking stick up in the yard, unable to recall who the president is and year.  She became concerned he may have a stroke and brought him to ED.  He has shown no other lateralizing symptoms but remains slightly confused. Neurology was asked to evaluate.   Date last known well: Date: 09/25/2014 Time last known well: Unable to determine tPA Given: No: out of window Modified Rankin: Rankin Score=0    Past Medical History  Diagnosis Date  . Neuropathy     mild  . UTI (lower urinary tract infection)   . HTN (hypertension)   . Atrial fibrillation   . Inguinal hernia     huge  . TIA (transient ischemic attack)     with aphasia  . DM (diabetes mellitus)   . Hearing loss   . CVD (cerebrovascular disease)   . Stroke     with arm weakness  . Skin cancer     Past Surgical History  Procedure Laterality Date  . Cataracts      bilaterally removed  . Bce removed from face      Family History  Problem Relation Age of Onset  . Congestive Heart Failure Mother   . Other Father     cerebral embolism  . Cancer Sister     kidney and a smoker  . Acute myelogenous leukemia Brother    Social History:  reports that he has never smoked. He does not have any smokeless tobacco history on file. His alcohol and drug histories are not on file.  Allergies: No Known Allergies  Medications:  No current facility-administered medications for this encounter.   Current Outpatient Prescriptions  Medication Sig Dispense Refill  . acetaminophen (TYLENOL) 500 MG tablet Take 500 mg by mouth every 6 (six) hours as needed.    Marland Kitchen aspirin 81 MG tablet Take 81 mg by mouth daily.    Marland Kitchen amLODipine (NORVASC) 5 MG tablet Take 5 mg by mouth daily.    Marland Kitchen atenolol (TENORMIN) 100 MG tablet Take 100 mg by mouth daily.    . benazepril (LOTENSIN) 40 MG tablet Take 40 mg by mouth daily.    . cloNIDine (CATAPRES) 0.2 MG tablet Take 0.2 mg by mouth 2 (two) times daily.    . hydrochlorothiazide (HYDRODIURIL) 25 MG tablet Take 50 mg by mouth daily. Take 2 tablets once a day for a total of 50 mg.    . potassium chloride SA (K-DUR,KLOR-CON) 20 MEQ tablet Take 20 mEq by mouth 2 (two) times daily.    . Prenatal Vit-Fe Fumarate-FA (M-VIT PO) Take 1 tablet by mouth daily.    . ranitidine (ZANTAC) 75 MG tablet Take 75 mg by mouth daily.    . simvastatin (ZOCOR) 20 MG tablet Take 20 mg by mouth daily.    Marland Kitchen warfarin (COUMADIN) 5 MG tablet Take 2.5 mg by mouth daily. 1/2 tab daily or as directed.      ROS:                                                                                                                                       History obtained from wife  General ROS: negative for - chills, fatigue, fever, night sweats, weight gain or weight loss Psychological ROS: negative for - behavioral disorder, hallucinations, memory difficulties, mood swings or suicidal ideation Ophthalmic ROS: negative for - blurry vision, double vision, eye pain or loss of vision ENT ROS: negative for - epistaxis, nasal discharge, oral lesions, sore throat, tinnitus or vertigo Allergy and Immunology ROS: negative for - hives or itchy/watery eyes Hematological and Lymphatic ROS: negative for - bleeding problems, bruising or swollen lymph nodes Endocrine ROS: negative for - galactorrhea, hair pattern  changes, polydipsia/polyuria or temperature intolerance Respiratory ROS: negative for - cough, hemoptysis, shortness of breath or wheezing Cardiovascular ROS: negative for - chest pain, dyspnea on exertion, edema or irregular heartbeat Gastrointestinal ROS: negative for - abdominal pain, diarrhea, hematemesis, nausea/vomiting or stool incontinence Genito-Urinary ROS: negative for - dysuria, hematuria, incontinence or urinary frequency/urgency Musculoskeletal ROS: negative for - joint swelling or muscular weakness Neurological ROS: as noted in HPI Dermatological ROS: negative for rash and skin lesion changes  Neurologic Examination:  Blood pressure 152/81, pulse 61, temperature 98.1 F (36.7 C), temperature source Oral, resp. rate 14, height 6' (1.829 m), weight 90.719 kg (200 lb), SpO2 99 %.  HEENT-  Normocephalic, no lesions, without obvious abnormality.  Normal external eye and conjunctiva.  Normal TM's bilaterally.  Normal auditory canals and external ears. Normal external nose, mucus membranes and septum.  Normal pharynx. Cardiovascular- irregularly irregular rhythm, pulses palpable throughout   Lungs- no tachypnea, retractions or cyanosis Abdomen- normal findings: bowel sounds normal Extremities- no edema Lymph-no adenopathy palpable Musculoskeletal-no joint tenderness, deformity or swelling Skin-warm and dry, no hyperpigmentation, vitiligo, or suspicious lesions  Neurological Examination Mental Status: Alert, oriented to month unable to give the year.  He is able to state the day of the week and hospital name. Unable to do seriel sevens 100-93-8.  86-7=81.  Speech fluent without evidence of aphasia.  Able to follow 3 step commands without difficulty. Cranial Nerves: II: Discs flat bilaterally; Visual fields grossly normal, pupils equal, round, reactive to light and  accommodation III,IV, VI: ptosis not present, extra-ocular motions intact bilaterally V,VII: smile symmetric, facial light touch sensation normal bilaterally VIII: hearing normal bilaterally IX,X: uvula rises symmetrically XI: bilateral shoulder shrug XII: midline tongue extension Motor: Right : Upper extremity   5/5    Left:     Upper extremity   5/5  Lower extremity   5/5     Lower extremity   5/5 Tone and bulk:normal tone throughout; no atrophy noted Sensory: Pinprick and light touch intact throughout, bilaterally Deep Tendon Reflexes: 2+ and symmetric throughout Plantars: Right: downgoing   Left: downgoing Cerebellar: normal finger-to-nose, normal rapid alternating movements and normal heel-to-shin test Gait: normal gait and station       Lab Results: Basic Metabolic Panel:  Recent Labs Lab 09/26/14 1344 09/26/14 1429  NA 139 139  K 3.4* 3.4*  CL 104 101  CO2 23  --   GLUCOSE 218* 226*  BUN 17 20  CREATININE 1.13 1.00  CALCIUM 9.8  --     Liver Function Tests:  Recent Labs Lab 09/26/14 1344  AST 22  ALT 14  ALKPHOS 66  BILITOT 1.1  PROT 7.0  ALBUMIN 3.9   No results for input(s): LIPASE, AMYLASE in the last 168 hours. No results for input(s): AMMONIA in the last 168 hours.  CBC:  Recent Labs Lab 09/26/14 1344 09/26/14 1429  WBC 8.6  --   NEUTROABS 6.5  --   HGB 16.1 16.3  HCT 45.5 48.0  MCV 86.7  --   PLT 212  --     Cardiac Enzymes: No results for input(s): CKTOTAL, CKMB, CKMBINDEX, TROPONINI in the last 168 hours.  Lipid Panel: No results for input(s): CHOL, TRIG, HDL, CHOLHDL, VLDL, LDLCALC in the last 168 hours.  CBG:  Recent Labs Lab 09/26/14 1524  GLUCAP 143*    Microbiology: Results for orders placed or performed during the hospital encounter of 04/06/10  Urine culture     Status: None   Collection Time: 04/06/10  3:48 PM  Result Value Ref Range Status   Specimen Description URINE, CLEAN CATCH  Final   Special  Requests NONE  Final   Culture  Setup Time [id number]  Final   Colony Count NO GROWTH  Final   Culture NO GROWTH  Final   Report Status 04/07/2010 FINAL  Final    Coagulation Studies:  Recent Labs  09/26/14 1344  LABPROT 14.7  INR 1.14    Imaging: Ct  Head (brain) Wo Contrast  09/26/2014   CLINICAL DATA:  Altered mental status  EXAM: CT HEAD WITHOUT CONTRAST  TECHNIQUE: Contiguous axial images were obtained from the base of the skull through the vertex without intravenous contrast.  COMPARISON:  10/07/2010  FINDINGS: The bony calvarium is intact. Diffuse atrophic changes are again noted. Chronic white matter ischemic change is seen with evidence of prior right frontal infarct and multiple small lacunar infarcts within the region of the basal ganglia. The overall appearance is stable. No findings to suggest acute hemorrhage, acute infarction or space-occupying mass lesion are noted.  IMPRESSION: Atrophic and chronic ischemic change.  No acute abnormality noted.   Electronically Signed   By: Inez Catalina M.D.   On: 09/26/2014 15:19       Assessment and plan discussed with with attending physician and they are in agreement.    Etta Quill PA-C [facility] [phone]  09/26/2014, 4:24 PM   Assessment: 81 y.o. male with sudden onset of confusion and word finding difficulty. He has chronic Afib with sub-therapeutic INR 1.17.  Exam shows confusion with no other localizing or lateralizing symptoms.  CT head shows no acute bleed or stroke.  ? Left brain infarct. Doubt seizures.  Admit to medicine, stroke work up.  Stroke Risk Factors - atrial fibrillation, diabetes mellitus and hypertension  1. HgbA1c, fasting lipid panel 2. MRI, MRA  of the brain without contrast 3. PT consult, OT consult, Speech consult 4. Echocardiogram 5. Carotid dopplers 6. Prophylactic therapy-Anticoagulation: Coumadin- dose per pharmacy 7. Risk factor modification 8. Telemetry monitoring 9.  Frequent neuro checks 10 NPO until passes stroke swallow screen  Patient seen and examined together with physician assistant and I concur with the assessment and plan.  Dorian Pod, MD

## 2014-09-26 NOTE — ED Provider Notes (Signed)
CSN: [id number]     Arrival date & time 09/26/14  1241 History   First MD Initiated Contact with Patient 09/26/14 1426     Chief Complaint  Patient presents with  . Altered Mental Status     (Consider location/radiation/quality/duration/timing/severity/associated sxs/prior Treatment) HPI  This is an 81 year old male with a history of atrial fibrillation, TIA, stroke, urinary tract infection who presents with confusion.  Patient's wife reports onset of confusion approximately 30 minutes prior to arrival at 12:30. She states that the patient was talking but wasn't making any sense. She denies noting any facial droop or slurred speech. He appeared disoriented. Upon arrival, he was evaluated by myself in triage. Nonfocal on exam. Fluent speech. Alert and oriented 3 but does not know his age or the president of the [place] which are things that his wife says that he would know.  Patient denies any pain. He states he does feel like he is having difficulty finding his words. He denies any recent illnesses, fever, shortness of breath and abdominal pain.  Past Medical History  Diagnosis Date  . Neuropathy     mild  . UTI (lower urinary tract infection)   . HTN (hypertension)   . Atrial fibrillation   . Inguinal hernia     huge  . TIA (transient ischemic attack)     with aphasia  . DM (diabetes mellitus)   . Hearing loss   . CVD (cerebrovascular disease)   . Stroke     with arm weakness  . Skin cancer    Past Surgical History  Procedure Laterality Date  . Cataracts      bilaterally removed  . Bce removed from face     Family History  Problem Relation Age of Onset  . Congestive Heart Failure Mother   . Other Father     cerebral embolism  . Cancer Sister     kidney and a smoker  . Acute myelogenous leukemia Brother    History  Substance Use Topics  . Smoking status: Never Smoker   . Smokeless tobacco: Not on file  . Alcohol Use: Not on file    Review of Systems   Constitutional: Negative.  Negative for fever.  Respiratory: Negative.  Negative for chest tightness and shortness of breath.   Cardiovascular: Negative.  Negative for chest pain.  Gastrointestinal: Negative.  Negative for abdominal pain.  Genitourinary: Negative.  Negative for dysuria.  Musculoskeletal: Negative for back pain.  Neurological: Positive for speech difficulty. Negative for dizziness, weakness, numbness and headaches.  All other systems reviewed and are negative.     Allergies  Review of patient's allergies indicates no known allergies.  Home Medications   Prior to Admission medications   Medication Sig Start Date End Date Taking? Authorizing Provider  acetaminophen (TYLENOL) 500 MG tablet Take 500 mg by mouth every 6 (six) hours as needed.   Yes Historical Provider, MD  amLODipine (NORVASC) 5 MG tablet Take 5 mg by mouth daily.    Historical Provider, MD  aspirin 81 MG tablet Take 81 mg by mouth daily.    Historical Provider, MD  atenolol (TENORMIN) 100 MG tablet Take 100 mg by mouth daily.    Historical Provider, MD  benazepril (LOTENSIN) 40 MG tablet Take 40 mg by mouth daily.    Historical Provider, MD  cloNIDine (CATAPRES) 0.2 MG tablet Take 0.2 mg by mouth 2 (two) times daily.    Historical Provider, MD  hydrochlorothiazide (HYDRODIURIL) 25 MG tablet Take  50 mg by mouth daily. Take 2 tablets once a day for a total of 50 mg.    Historical Provider, MD  potassium chloride SA (K-DUR,KLOR-CON) 20 MEQ tablet Take 20 mEq by mouth 2 (two) times daily.    Historical Provider, MD  Prenatal Vit-Fe Fumarate-FA (M-VIT PO) Take 1 tablet by mouth daily.    Historical Provider, MD  ranitidine (ZANTAC) 75 MG tablet Take 75 mg by mouth daily.    Historical Provider, MD  simvastatin (ZOCOR) 20 MG tablet Take 20 mg by mouth daily.    Historical Provider, MD  warfarin (COUMADIN) 5 MG tablet Take 2.5 mg by mouth daily. 1/2 tab daily or as directed.    Historical Provider, MD   BP  153/86 mmHg  Pulse 66  Temp(Src) 98.8 F (37.1 C) (Oral)  Resp 16  Ht 6' (1.829 m)  Wt 200 lb (90.719 kg)  BMI 27.12 kg/m2  SpO2 97% Physical Exam  Constitutional: He is oriented to person, place, and time. No distress.  Elderly  HENT:  Head: Normocephalic and atraumatic.  Eyes: EOM are normal. Pupils are equal, round, and reactive to light.  Cardiovascular: Normal rate, regular rhythm and normal heart sounds.   No murmur heard. Pulmonary/Chest: Effort normal and breath sounds normal. No respiratory distress. He has no wheezes.  Abdominal: Soft. Bowel sounds are normal. There is no tenderness. There is no rebound.  Musculoskeletal: He exhibits no edema.  Lymphadenopathy:    He has no cervical adenopathy.  Neurological: He is alert and oriented to person, place, and time.  Oriented to person, place, and time, does not know the president or his age, fluent speech, can name and repeat, follow 5 strength in all 4 extremities, no dysmetria to finger-nose-finger, normal reflexes  Skin: Skin is warm and dry.  Psychiatric: He has a normal mood and affect.  Nursing note and vitals reviewed.   ED Course  Procedures (including critical care time) Labs Review Labs Reviewed  CBG MONITORING, ED - Abnormal; Notable for the following:    Glucose-Capillary 143 (*)    All other components within normal limits  I-STAT CHEM 8, ED - Abnormal; Notable for the following:    Potassium 3.4 (*)    Glucose, Bld 226 (*)    All other components within normal limits  CBC  DIFFERENTIAL  URINALYSIS, ROUTINE W REFLEX MICROSCOPIC  PROTIME-INR  APTT  COMPREHENSIVE METABOLIC PANEL  I-STAT TROPOININ, ED    Imaging Review Ct Head (brain) Wo Contrast  09/26/2014   CLINICAL DATA:  Altered mental status  EXAM: CT HEAD WITHOUT CONTRAST  TECHNIQUE: Contiguous axial images were obtained from the base of the skull through the vertex without intravenous contrast.  COMPARISON:  10/07/2010  FINDINGS: The bony  calvarium is intact. Diffuse atrophic changes are again noted. Chronic white matter ischemic change is seen with evidence of prior right frontal infarct and multiple small lacunar infarcts within the region of the basal ganglia. The overall appearance is stable. No findings to suggest acute hemorrhage, acute infarction or space-occupying mass lesion are noted.  IMPRESSION: Atrophic and chronic ischemic change.  No acute abnormality noted.   Electronically Signed   By: Inez Catalina M.D.   On: 09/26/2014 15:19     EKG Interpretation   Date/Time:  Friday September 26 2014 12:49:24 EST Ventricular Rate:  56 PR Interval:    QRS Duration: 86 QT Interval:  420 QTC Calculation: 405 R Axis:   60 Text Interpretation:  Atrial fibrillation  No significant change since last  tracing Confirmed by HORTON  MD, COURTNEY ([id number]) on 09/26/2014 3:34:51 PM      MDM   Final diagnoses:  Transient cerebral ischemia, unspecified transient cerebral ischemia type    Patient presents with confusion and word-finding difficulty.  Fluent on exam and nonfocal.  Does appear confused.  INitial stroke work-up unremarkable.  Neurology consulted and requesting admit for TIA work-up.      Merryl Hacker, MD 09/27/14 1026

## 2014-09-26 NOTE — ED Notes (Signed)
About 30 minutes prior to arrival, pt's wife reports pt having a sudden onset of confusion.  Pt's wife sts that pt is normally CAO x 4.  Dr. Dina Rich in triage to assess pt.  Pt unable to state year nor president in triage.  Wife sts pt normally knows this information.  Pt with previous hx of strokes.

## 2014-09-26 NOTE — Progress Notes (Signed)
Patient left room for MRI, TELE removed and CCMD notified.   Quetzaly Ebner, RN.

## 2014-09-26 NOTE — Progress Notes (Addendum)
ANTICOAGULATION CONSULT NOTE - Initial Consult  Pharmacy Consult for Warfarin Indication: atrial fibrillation  No Known Allergies  Patient Measurements: Height: 6' (182.9 cm) Weight: 200 lb (90.719 kg) IBW/kg (Calculated) : 77.6  Vital Signs: Temp: 98.1 F (36.7 C) (03/11 1553) Temp Source: Oral (03/11 1245) BP: 144/79 mmHg (03/11 1730) Pulse Rate: 64 (03/11 1730)  Labs:  Recent Labs  09/26/14 1344 09/26/14 1429  HGB 16.1 16.3  HCT 45.5 48.0  PLT 212  --   APTT 30  --   LABPROT 14.7  --   INR 1.14  --   CREATININE 1.13 1.00    Estimated Creatinine Clearance: 63.6 mL/min (by C-G formula based on Cr of 1).   Medical History: Past Medical History  Diagnosis Date  . Neuropathy     mild  . UTI (lower urinary tract infection)   . HTN (hypertension)   . Atrial fibrillation   . Inguinal hernia     huge  . TIA (transient ischemic attack)     with aphasia  . DM (diabetes mellitus)   . Hearing loss   . CVD (cerebrovascular disease)   . Stroke     with arm weakness  . Skin cancer     Medications:   (Not in a hospital admission) Scheduled:  Infusions:   Assessment: 81yo male with history of Afib, TIA, and stroke presents with AMS. Pharmacy is consulted to dose warfarin for atrial fibrillation. CBC is wnl, INR subtherapeutic at 1.14, Trop neg x2, CK 49.  PTA warfarin dose: do not currently have dose and will need to follow-up with family  Goal of Therapy:  INR 2-3 Monitor platelets by anticoagulation protocol: Yes   Plan:  F/u with patient to redose warfarin Daily INR Continue to monitor H&H and platelets  F/u MRI. If no bleeding, Dr. Charlies Silvers wants to start lovenox bridge  Andrey Cota. Diona Foley, PharmD Clinical Pharmacist Pager [phone] 09/26/2014,6:03 PM  Addendum: -MRI is negative for bleed. Discussed with Fredirick Maudlin NP and ok to proceed with lovenox -Home coumadin dose verified as 2.5mg /day (per family he has not taken his coumadin  today)  Plan -Lovenox 90mg  sq q12h -Coumadin 4mg  po tonight -CBC every 3 days while on lovenox  Hildred Laser, Pharm D 09/26/2014 9:25 PM

## 2014-09-26 NOTE — H&P (Signed)
[facility] History and Physical  Derrick Ross VBT:[id number] DOB: 13-Jan-1931 DOA: 09/26/2014  Referring physician: ER physician PCP: Horton Finer, MD   Chief Complaint: altered mental status   HPI:  81 year old male with a history of atrial fibrillation on anticoagulation with coumadin, TIA, CVA, dyslipidemia, hypertension who presented to [facility] ED with sudden onset altered mental status the night  Prior to this admission. Family not at the moment with the patient but per chart review apparently pt was not himself the night prior to this admission and also having difficult speaking although this has improved on admission. No LE or UE weakness, no facial droop, no falls or loss of consciousness. No dizziness and non nausea or vomiting. No other symptoms such as chest pain, shortness of breath or palpitations.  In ED< pt is hemodynamically stable. CT head did not show acute intracranial findings. His blood work revealed mild hypokalemia which was supplemented. CT head did not reveal acute findings. His was admitted for stroke work up. Neurology has seen the pt in consultation.    Assessment & Plan    Principal Problem:   Confusion / Acute encephalopathy / Possible CVA - stroke order set in place Stroke work up:  Aspirin daily MRI brain / MRA brain - no acute intracranial findings; evidence of remote infarct in right frontal lobe 2D ECHO Carotid doppler HgbA1c Lipid panel VTE prophylaxis - pt on full ose anticoagulation with coumadin, INR sub therapeutic so will bridging with Lovenox if MRI shows no bleed Diet: heart healthy if passes swallow screen Therapy: PT/OT/SLP Hyperlipidemia  LDL ; LDL goal < 100  Patient on simvastatin 20 mg daily  Other Stroke Risk Factors  Advanced age History of CVA - MRI brain with evidence of remote anterior right frontal lobe infarct  HTN   Active Problems:   HTN (hypertension) - Resume Norvasc, Atenolol, Lotensin, Catapres,  Hctz    Dyslipidemia - resumed simvastatin     Atrial fibrillation - CHADS2-vasc score 6   DVT prophylaxis:  - on full anticoagulation with coumadin  - as mentioned pt will need bridging with Lovenox if no bleed on MRI   Radiological Exams on Admission: Ct Head (brain) Wo Contrast 09/26/2014   Atrophic and chronic ischemic change.  No acute abnormality noted.   Electronically Signed   By: Inez Catalina M.D.   On: 09/26/2014 15:19   Code Status: Full Family Communication: Family not at the bedside  Disposition Plan: Admit for further evaluation, telemetry   Alorton, [name], MD  [facility] Pager [phone]  Review of Systems:  Constitutional: Negative for fever, chills and malaise/fatigue. Negative for diaphoresis.  HENT: Negative for hearing loss, ear pain, nosebleeds, congestion, sore throat, neck pain, tinnitus and ear discharge.   Eyes: Negative for blurred vision, double vision, photophobia, pain, discharge and redness.  Respiratory: Negative for cough, hemoptysis, sputum production, shortness of breath, wheezing and stridor.   Cardiovascular: Negative for chest pain, palpitations, orthopnea, claudication and leg swelling.  Gastrointestinal: Negative for nausea, vomiting and abdominal pain. Negative for heartburn, constipation, blood in stool and melena.  Genitourinary: Negative for dysuria, urgency, frequency, hematuria and flank pain.  Musculoskeletal: Negative for myalgias, back pain, joint pain and falls.  Skin: Negative for itching and rash.  Neurological: per HPI Endo/Heme/Allergies: Negative for environmental allergies and polydipsia. Does not bruise/bleed easily.  Psychiatric/Behavioral: Negative for suicidal ideas. The patient is not nervous/anxious.      Past Medical History  Diagnosis Date  . Neuropathy  mild  . UTI (lower urinary tract infection)   . HTN (hypertension)   . Atrial fibrillation   . Inguinal hernia     huge  . TIA (transient ischemic  attack)     with aphasia  . DM (diabetes mellitus)   . Hearing loss   . CVD (cerebrovascular disease)   . Stroke     with arm weakness  . Skin cancer    Past Surgical History  Procedure Laterality Date  . Cataracts      bilaterally removed  . Bce removed from face     Social History:  reports that he has never smoked. He does not have any smokeless tobacco history on file. His alcohol and drug histories are not on file.  No Known Allergies  Family History:  Family History  Problem Relation Age of Onset  . Congestive Heart Failure Mother   . Other Father     cerebral embolism  . Cancer Sister     kidney and a smoker  . Acute myelogenous leukemia Brother      Prior to Admission medications   Medication Sig Start Date End Date Taking? Authorizing Provider  acetaminophen (TYLENOL) 500 MG tablet Take 500 mg by mouth every 6 (six) hours as needed.   Yes Historical Provider, MD  aspirin 81 MG tablet Take 81 mg by mouth daily.   Yes Historical Provider, MD  amLODipine (NORVASC) 5 MG tablet Take 5 mg by mouth daily.    Historical Provider, MD  atenolol (TENORMIN) 100 MG tablet Take 100 mg by mouth daily.    Historical Provider, MD  benazepril (LOTENSIN) 40 MG tablet Take 40 mg by mouth daily.    Historical Provider, MD  cloNIDine (CATAPRES) 0.2 MG tablet Take 0.2 mg by mouth 2 (two) times daily.    Historical Provider, MD  hydrochlorothiazide (HYDRODIURIL) 25 MG tablet Take 50 mg by mouth daily. Take 2 tablets once a day for a total of 50 mg.    Historical Provider, MD  potassium chloride SA (K-DUR,KLOR-CON) 20 MEQ tablet Take 20 mEq by mouth 2 (two) times daily.    Historical Provider, MD  Prenatal Vit-Fe Fumarate-FA (M-VIT PO) Take 1 tablet by mouth daily.    Historical Provider, MD  ranitidine (ZANTAC) 75 MG tablet Take 75 mg by mouth daily.    Historical Provider, MD  simvastatin (ZOCOR) 20 MG tablet Take 20 mg by mouth daily.    Historical Provider, MD  warfarin (COUMADIN) 5  MG tablet Take 2.5 mg by mouth daily. 1/2 tab daily or as directed.    Historical Provider, MD   Physical Exam: Filed Vitals:   09/26/14 1600 09/26/14 1700 09/26/14 1715 09/26/14 1730  BP: 152/81 138/96 134/84 144/79  Pulse:  60 77 64  Temp:      TempSrc:      Resp: 14 14 13 12   Height:      Weight:      SpO2: 99% 96% 98% 97%    Physical Exam  Constitutional: Appears well-developed and well-nourished. No distress.  HENT: Normocephalic. No tonsillar erythema or exudates Eyes: Conjunctivae and EOM are normal. PERRLA, no scleral icterus.  Neck: Normal ROM. Neck supple. No JVD. No tracheal deviation. No thyromegaly.  CVS: RRR, S1/S2 +, no murmurs, no gallops, no carotid bruit.  Pulmonary: Effort and breath sounds normal, no stridor, rhonchi, wheezes, rales.  Abdominal: Soft. BS +,  no distension, tenderness, rebound or guarding.  Musculoskeletal: Normal range of motion. No edema  and no tenderness.  Lymphadenopathy: No lymphadenopathy noted, cervical, inguinal. Neuro: Alert. Normal reflexes, muscle tone coordination. No focal neurologic deficits. Skin: Skin is warm and dry. No rash noted.  No erythema. No pallor.  Psychiatric: Normal mood and affect. Behavior, judgment, thought content normal.   Labs on Admission:  Basic Metabolic Panel:  Recent Labs Lab 09/26/14 1344 09/26/14 1429  NA 139 139  K 3.4* 3.4*  CL 104 101  CO2 23  --   GLUCOSE 218* 226*  BUN 17 20  CREATININE 1.13 1.00  CALCIUM 9.8  --    Liver Function Tests:  Recent Labs Lab 09/26/14 1344  AST 22  ALT 14  ALKPHOS 66  BILITOT 1.1  PROT 7.0  ALBUMIN 3.9   No results for input(s): LIPASE, AMYLASE in the last 168 hours. No results for input(s): AMMONIA in the last 168 hours. CBC:  Recent Labs Lab 09/26/14 1344 09/26/14 1429  WBC 8.6  --   NEUTROABS 6.5  --   HGB 16.1 16.3  HCT 45.5 48.0  MCV 86.7  --   PLT 212  --    Cardiac Enzymes: No results for input(s): CKTOTAL, CKMB, CKMBINDEX,  TROPONINI in the last 168 hours. BNP: Invalid input(s): POCBNP CBG:  Recent Labs Lab 09/26/14 1524  GLUCAP 143*    If 7PM-7AM, please contact night-coverage [email or web address] Password [facility] 09/26/2014, 5:48 PM

## 2014-09-26 NOTE — Progress Notes (Signed)
Patient arrived to floor from ED with family. Safety precautions and orders reviewed with patient/family. Oriented patient/family to unit. Call light and possession within reach. Alarm activated. TELE applied and confirmed. Will continue to monitor.   Ave Filter, RN

## 2014-09-26 NOTE — ED Notes (Signed)
Checked patient blood sugar it was 143 notified RN of blood sugar

## 2014-09-26 NOTE — ED Notes (Signed)
Attempted report 

## 2014-09-26 NOTE — ED Notes (Signed)
Per Dr. Dina Rich, hold on calling code stroke.

## 2014-09-27 DIAGNOSIS — R410 Disorientation, unspecified: Secondary | ICD-10-CM | POA: Diagnosis not present

## 2014-09-27 DIAGNOSIS — I482 Chronic atrial fibrillation: Secondary | ICD-10-CM

## 2014-09-27 DIAGNOSIS — E785 Hyperlipidemia, unspecified: Secondary | ICD-10-CM

## 2014-09-27 DIAGNOSIS — I10 Essential (primary) hypertension: Secondary | ICD-10-CM

## 2014-09-27 LAB — LIPID PANEL
CHOLESTEROL: 138 mg/dL (ref 0–200)
HDL: 42 mg/dL (ref >39)
LDL Cholesterol: 74 mg/dL (ref 0–99)
Total CHOL/HDL Ratio: 3.3 RATIO
Triglycerides: 110 mg/dL (ref <150)
VLDL: 22 mg/dL (ref 0–40)

## 2014-09-27 LAB — PROTIME-INR
INR: 1.14 (ref 0.00–1.49)
PROTHROMBIN TIME: 14.8 s (ref 11.6–15.2)

## 2014-09-27 LAB — TSH: TSH: 0.921 u[IU]/mL (ref 0.350–4.500)

## 2014-09-27 MED ORDER — APIXABAN 5 MG PO TABS: 5.0000 mg | ORAL_TABLET | Freq: Two times a day (BID) | ORAL | Status: AC

## 2014-09-27 NOTE — Progress Notes (Signed)
Pt transported out of unit per wheelchair by nurse tech. No acute distress noted.     Derrick Ross I 09/27/2014 2:43 PM

## 2014-09-27 NOTE — Progress Notes (Signed)
Discharge instructions and prescription given to pt. Pt verbally acknowledged understanding. Pt voiced no questions when prompted. nure verified with pt that dentures in place, ring on finger and clothing with pt. Pt to be transported home by wife per private car. Will monitor   Derrick Ross I 09/27/2014 1:47 PM

## 2014-09-27 NOTE — Progress Notes (Addendum)
NEURO HOSPITALIST PROGRESS NOTE   SUBJECTIVE:                                                                                                                        Uneventful night. Wife at the bedside said that he seems to be doing better today. MRI brain was personally reviewed and showed no acute abnormality. MRA brain demonstrates stable distal small vessel disease without significant proximal stenosis, aneurysm, or branch vessel occlusion. TTE and CUS pending. Cholesterol 138, HDL 42, LDL 74 On coumadin, INR 1.14  OBJECTIVE:                                                                                                                           Vital signs in last 24 hours: Temp:  [97.7 F (36.5 C)-98.8 F (37.1 C)] 98.1 F (36.7 C) (03/12 0931) Pulse Rate:  [51-77] 66 (03/12 0931) Resp:  [12-20] 18 (03/12 0931) BP: (127-175)/(70-96) 148/71 mmHg (03/12 0931) SpO2:  [93 %-99 %] 93 % (03/12 0931) Weight:  [90.719 kg (200 lb)] 90.719 kg (200 lb) (03/11 1245)  Intake/Output from previous day:   Intake/Output this shift: Total I/O In: 300 [P.O.:300] Out: -  Nutritional status: Diet Heart  Past Medical History  Diagnosis Date  . Neuropathy     mild  . UTI (lower urinary tract infection)   . HTN (hypertension)   . Atrial fibrillation   . Inguinal hernia     huge  . TIA (transient ischemic attack)     with aphasia  . DM (diabetes mellitus)   . Hearing loss   . CVD (cerebrovascular disease)   . Stroke     with arm weakness  . Skin cancer    Physical Examination:   HEENT- Normocephalic, no lesions, without obvious abnormality. Normal external eye and conjunctiva. Normal TM's bilaterally. Normal auditory canals and external ears. Normal external nose, mucus membranes and septum. Normal pharynx. Cardiovascular- irregularly  irregular rhythm, pulses palpable throughout  Lungs- no tachypnea, retractions or cyanosis Abdomen- normal findings: bowel sounds normal Extremities- no edema Lymph-no adenopathy palpable Musculoskeletal-no joint tenderness, deformity or swelling Skin-warm and dry, no hyperpigmentation, vitiligo, or suspicious lesions  Neurologic Exam:  Mental Status: Alert, oriented to month  unable to give the year. He is able to state the day of the week and hospital name. Unable to do seriel sevens 100-93-8. 86-7=81. Speech fluent without evidence of aphasia. Able to follow 3 step commands without difficulty. Cranial Nerves: II: Discs flat bilaterally; Visual fields grossly normal, pupils equal, round, reactive to light and accommodation III,IV, VI: ptosis not present, extra-ocular motions intact bilaterally V,VII: smile symmetric, facial light touch sensation normal bilaterally VIII: hearing normal bilaterally IX,X: uvula rises symmetrically XI: bilateral shoulder shrug XII: midline tongue extension Motor: Right :Upper extremity 5/5Left: Upper extremity 5/5 Lower extremity 5/5Lower extremity 5/5 Tone and bulk:normal tone throughout; no atrophy noted Sensory: Pinprick and light touch intact throughout, bilaterally Deep Tendon Reflexes: 2+ and symmetric throughout Plantars: Right: downgoingLeft: downgoing Cerebellar: normal finger-to-nose, normal rapid alternating movements and normal heel-to-shin test Gait: normal gait and station  Lab Results: Lab Results  Component Value Date/Time   CHOL 138 09/27/2014 06:00 AM   Lipid Panel  Recent Labs  09/27/14 0600  CHOL 138  TRIG 110  HDL 42  CHOLHDL 3.3  VLDL 22  LDLCALC 74    Studies/Results: Ct Head (brain) Wo Contrast  09/26/2014   CLINICAL DATA:  Altered mental status  EXAM: CT HEAD  WITHOUT CONTRAST  TECHNIQUE: Contiguous axial images were obtained from the base of the skull through the vertex without intravenous contrast.  COMPARISON:  10/07/2010  FINDINGS: The bony calvarium is intact. Diffuse atrophic changes are again noted. Chronic white matter ischemic change is seen with evidence of prior right frontal infarct and multiple small lacunar infarcts within the region of the basal ganglia. The overall appearance is stable. No findings to suggest acute hemorrhage, acute infarction or space-occupying mass lesion are noted.  IMPRESSION: Atrophic and chronic ischemic change.  No acute abnormality noted.   Electronically Signed   By: Inez Catalina M.D.   On: 09/26/2014 15:19   Mr Brain Wo Contrast  09/26/2014   CLINICAL DATA:  Confusion beginning last night. Difficulty finding words this morning. Persistent confusion.  EXAM: MRI HEAD WITHOUT CONTRAST  MRA HEAD WITHOUT CONTRAST  TECHNIQUE: Multiplanar, multiecho pulse sequences of the brain and surrounding structures were obtained without intravenous contrast. Angiographic images of the head were obtained using MRA technique without contrast.  COMPARISON:  CT head without contrast from the same day. MRI brain an MRA head 10/07/2010  FINDINGS: MRI HEAD FINDINGS  The diffusion-weighted images demonstrate no evidence for acute or subacute infarction. A remote right frontal lobe infarct is again noted. Advanced atrophy and extensive white matter changes are present bilaterally. Stents in dilated perivascular spaces are noted in the basal ganglia bilaterally. Confluent periventricular and subcortical white matter disease is similar to the prior exam.  Remote lacunar infarcts are present within the thalami and basal ganglia bilaterally. No acute hemorrhage or mass lesion is present. Remote lacunar infarcts are noted within the brainstem.  Flow is present in the major intracranial arteries. Bilateral lens replacements are noted. The paranasal sinuses  and the mastoid air cells are clear.  MRA HEAD FINDINGS  The internal carotid arteries are within normal limits from the high cervical segments through the ICA termini. The left A1 segment is aplastic. Both A2 segments fill from the right. The MCA bifurcations are intact. Mild distal small vessel attenuation is present throughout the anterior circulation.  Left vertebral artery is dominant. The PICA origins are visualized and normal basilar artery is within normal limits. The left posterior cerebral artery originates from the  basilar tip. The right posterior cerebral artery is of fetal type. There is mild attenuation of distal PCA branch vessels, right greater than left. This is similar to the prior exam.  IMPRESSION: 1. No acute intracranial abnormality or significant interval change. 2. Remote anterior right frontal lobe infarct. 3. Stable advanced atrophy and diffuse white matter disease. 4. Remote lacunar infarcts involving the basal ganglia and brainstem. 5. The MRA demonstrates stable distal small vessel disease without significant proximal stenosis, aneurysm, or branch vessel occlusion.   Electronically Signed   By: San Morelle M.D.   On: 09/26/2014 20:42   Mr Jodene Nam Head/brain Wo Cm  09/26/2014   CLINICAL DATA:  Confusion beginning last night. Difficulty finding words this morning. Persistent confusion.  EXAM: MRI HEAD WITHOUT CONTRAST  MRA HEAD WITHOUT CONTRAST  TECHNIQUE: Multiplanar, multiecho pulse sequences of the brain and surrounding structures were obtained without intravenous contrast. Angiographic images of the head were obtained using MRA technique without contrast.  COMPARISON:  CT head without contrast from the same day. MRI brain an MRA head 10/07/2010  FINDINGS: MRI HEAD FINDINGS  The diffusion-weighted images demonstrate no evidence for acute or subacute infarction. A remote right frontal lobe infarct is again noted. Advanced atrophy and extensive white matter changes are present  bilaterally. Stents in dilated perivascular spaces are noted in the basal ganglia bilaterally. Confluent periventricular and subcortical white matter disease is similar to the prior exam.  Remote lacunar infarcts are present within the thalami and basal ganglia bilaterally. No acute hemorrhage or mass lesion is present. Remote lacunar infarcts are noted within the brainstem.  Flow is present in the major intracranial arteries. Bilateral lens replacements are noted. The paranasal sinuses and the mastoid air cells are clear.  MRA HEAD FINDINGS  The internal carotid arteries are within normal limits from the high cervical segments through the ICA termini. The left A1 segment is aplastic. Both A2 segments fill from the right. The MCA bifurcations are intact. Mild distal small vessel attenuation is present throughout the anterior circulation.  Left vertebral artery is dominant. The PICA origins are visualized and normal basilar artery is within normal limits. The left posterior cerebral artery originates from the basilar tip. The right posterior cerebral artery is of fetal type. There is mild attenuation of distal PCA branch vessels, right greater than left. This is similar to the prior exam.  IMPRESSION: 1. No acute intracranial abnormality or significant interval change. 2. Remote anterior right frontal lobe infarct. 3. Stable advanced atrophy and diffuse white matter disease. 4. Remote lacunar infarcts involving the basal ganglia and brainstem. 5. The MRA demonstrates stable distal small vessel disease without significant proximal stenosis, aneurysm, or branch vessel occlusion.   Electronically Signed   By: San Morelle M.D.   On: 09/26/2014 20:42    MEDICATIONS  Scheduled: .  stroke: mapping our early stages of recovery book   Does not apply Once  . amLODipine  5 mg Oral Daily  .  aspirin EC  81 mg Oral Daily  . atenolol  100 mg Oral Daily  . benazepril  40 mg Oral Daily  . cloNIDine  0.2 mg Oral BID  . enoxaparin (LOVENOX) injection  90 mg Subcutaneous Q12H  . hydrochlorothiazide  50 mg Oral Daily  . potassium chloride SA  20 mEq Oral BID  . simvastatin  20 mg Oral Daily  . warfarin  4 mg Oral Once  . Warfarin - Pharmacist Dosing Inpatient   Does not apply q1800    ASSESSMENT/PLAN:                                                                                                            81 y.o. male with sudden onset of confusion and word finding difficulty. He has chronic Afib with sub-therapeutic INR 1.14. ? TIA left brain. ? Underlying cognitive dysfunction. TTE and CUS pending. Continue coumadin as per pharmacy.  Dorian Pod, MD [facility] [phone]  09/27/2014, 11:04 AM   Addendum: discussed with medicine attending Dr. Dyann Kief who explained to me that patient had had great difficulty being achivieing a therapeutic INR in the outpatient setting and he proposed switching to a NOAC and I fully concur with his plan. Ok to cancel TTE and CUS and sent patient home today.  Dorian Pod, MD

## 2014-09-27 NOTE — Discharge Summary (Signed)
Physician Discharge Summary  Derrick Ross VQM:[id number] DOB: 09/01/1930 DOA: 09/26/2014  PCP: Horton Finer, MD  Admit date: 09/26/2014 Discharge date: 09/27/2014  Time spent: 30 minutes  Recommendations for Outpatient Follow-up:  1. Check basic metabolic panel to follow electrolytes and renal function 2. Reassess CBC to follow hemoglobin trend 3. Patient has now been started on Eliquis for better and more stable anticoagulation 4. Close follow-up of patient's CBGs and A1c results; please if levels above 6.5 initiate oral hypoglycemic regimen  Discharge Diagnoses:  Acute encephalopathy/Confusion TIA (transient ischemic attack) History of CVA (cerebral infarction) Essential HTN (hypertension) Dyslipidemia Chronic Atrial fibrillation GERD Chronic kidney disease (stage II-III)   Discharge Condition: Stable and improved. Patient discharged home with instructions to follow with PCP in 10 days.  Diet recommendation: Low sodium and low carbohydrates diet  Filed Weights   09/26/14 1245  Weight: 90.719 kg (200 lb)    History of present illness:  81 year old male with a history of atrial fibrillation on anticoagulation with coumadin, TIA, CVA, dyslipidemia, hypertension who presented to [facility] ED with sudden onset altered mental status the night Prior to this admission. Family not at the moment with the patient but per chart review apparently pt was not himself the night prior to this admission and also having difficult speaking although this has improved on admission. No LE or UE weakness, no facial droop, no falls or loss of consciousness. No dizziness and non nausea or vomiting. No other symptoms such as chest pain, shortness of breath or palpitations.   Hospital Course:  1-confusion/acute encephalopathy: Secondary to TIA -Patient with negative CT scan and MRI/MRA of the brain for acute intracranial abnormalities; positive for remote infarct in the right frontal lobe. -Risk  factors include: Hypertension, hyperlipidemia, diabetes mellitus, previous stroke and atrial fibrillation (chronically on Coumadin but having a hard time being therapeutic) -Normal TSH -After discussing with neurology and complete resolution of his symptoms without any signs of acute stroke; the decision not to pursue any further inpatient acute stroke work up was taken. Patient will be switch to eliquis twice a day for secondary prevention in order to provide more steady and reliable anticoagulation level. -Will continue aggressive modification to his risk factors  2-atrial fibrillation: Chronic -Rate controlled -Patient having hard time being therapeutic with the use of Coumadin, which is equivalent of known being protected. -His CHADs 2 Vasc Score is 5-6 and will definitely benefit of anticoagulation -After discussing with neurology services the plan is to switch patient to telemetry quit his 5 mg by mouth twice a day  3-hyperlipidemia: Continue statins -LDL 74  4-essential hypertension: Stable and well control -Will continue the use of Norvasc, atenolol, Lotensin, Catapres and HCTZ -Patient advised to follow a low sodium/heart healthy diet  5-GERD: Continue the use of Zantac  6-diabetes mellitus type 2 with chronic nephropathy: Patient was following just diet control prior to admission. -A1c has been order but pending at the moment of discharge -CBGs were running in the 200-220 range -Patient was advised to follow a low carbohydrates diet -PCP to follow closely and initiate treatment for diabetes; goal is A1C < 6.5; if higher than this will need to be started on hypoglycemic regimen (recommendations will be for use of Amaryl)  7-chronic kidney disease stage II-III: Appears to be secondary to hypertension and prolonged history of diabetes -Stable and well controlled. Creatinine level at baseline  Procedures:  See below for x-ray reports  Consultations:  Neurology  Discharge  Exam: Filed Vitals:  09/27/14 0931  BP: 148/71  Pulse: 66  Temp: 98.1 F (36.7 C)  Resp: 18    General: Afebrile, denies chest pain or shortness of breath. Patient's mentation is back to normal (corroborated and in agreement as per wife at bedside). Cardiovascular: Rate control, no JVD, no rubs, no gallops Respiratory: Clear to auscultation bilaterally, no retractions Abdomen: Positive bowel sounds, soft, nontender, nondistended Extremities no edema, no cyanosis Neurologic exam: Alert, awake and oriented 3, no evidence of the fascia and patient able to follow three-step commands without difficulty; muscle strength 5 out of 5 bilaterally and symmetrically; normal finger to nose and normal pinprick/light touch  Discharge Instructions   Discharge Instructions    Diet - low sodium heart healthy    Complete by:  As directed      Discharge instructions    Complete by:  As directed   Take medications as prescribed Maintain good hydration Follow up with PCP in 10 days Follow a heart healthy diet Remember no more Aspirin and no more coumadin          Current Discharge Medication List    START taking these medications   Details  apixaban (ELIQUIS) 5 MG TABS tablet Take 1 tablet (5 mg total) by mouth 2 (two) times daily. Take first dose at 10pm tonight and then 8:00 and 8:00 Qty: 60 tablet, Refills: 1      CONTINUE these medications which have NOT CHANGED   Details  acetaminophen (TYLENOL) 500 MG tablet Take 500 mg by mouth every 6 (six) hours as needed.    amLODipine (NORVASC) 5 MG tablet Take 5 mg by mouth daily.    atenolol (TENORMIN) 100 MG tablet Take 100 mg by mouth daily.    benazepril (LOTENSIN) 40 MG tablet Take 40 mg by mouth daily.    cloNIDine (CATAPRES) 0.2 MG tablet Take 0.2 mg by mouth 2 (two) times daily.    hydrochlorothiazide (HYDRODIURIL) 25 MG tablet Take 50 mg by mouth daily. Take 2 tablets once a day for a total of 50 mg.    potassium chloride SA  (K-DUR,KLOR-CON) 20 MEQ tablet Take 20 mEq by mouth 2 (two) times daily.    Prenatal Vit-Fe Fumarate-FA (M-VIT PO) Take 1 tablet by mouth daily.    ranitidine (ZANTAC) 75 MG tablet Take 75 mg by mouth daily.    simvastatin (ZOCOR) 20 MG tablet Take 20 mg by mouth daily.      STOP taking these medications     aspirin 81 MG tablet      warfarin (COUMADIN) 5 MG tablet        No Known Allergies Follow-up Information    Follow up with Dorian Heckle, MD. Schedule an appointment as soon as possible for a visit in 10 days.   Specialty:  Internal Medicine   Contact information:   [address] [place] [address] [phone]       The results of significant diagnostics from this hospitalization (including imaging, microbiology, ancillary and laboratory) are listed below for reference.    Significant Diagnostic Studies: Ct Head (brain) Wo Contrast  09/26/2014   CLINICAL DATA:  Altered mental status  EXAM: CT HEAD WITHOUT CONTRAST  TECHNIQUE: Contiguous axial images were obtained from the base of the skull through the vertex without intravenous contrast.  COMPARISON:  10/07/2010  FINDINGS: The bony calvarium is intact. Diffuse atrophic changes are again noted. Chronic white matter ischemic change is seen with evidence of prior right frontal infarct  and multiple small lacunar infarcts within the region of the basal ganglia. The overall appearance is stable. No findings to suggest acute hemorrhage, acute infarction or space-occupying mass lesion are noted.  IMPRESSION: Atrophic and chronic ischemic change.  No acute abnormality noted.   Electronically Signed   By: Inez Catalina M.D.   On: 09/26/2014 15:19   Mr Brain Wo Contrast  09/26/2014   CLINICAL DATA:  Confusion beginning last night. Difficulty finding words this morning. Persistent confusion.  EXAM: MRI HEAD WITHOUT CONTRAST  MRA HEAD WITHOUT CONTRAST  TECHNIQUE: Multiplanar, multiecho pulse sequences of the brain and  surrounding structures were obtained without intravenous contrast. Angiographic images of the head were obtained using MRA technique without contrast.  COMPARISON:  CT head without contrast from the same day. MRI brain an MRA head 10/07/2010  FINDINGS: MRI HEAD FINDINGS  The diffusion-weighted images demonstrate no evidence for acute or subacute infarction. A remote right frontal lobe infarct is again noted. Advanced atrophy and extensive white matter changes are present bilaterally. Stents in dilated perivascular spaces are noted in the basal ganglia bilaterally. Confluent periventricular and subcortical white matter disease is similar to the prior exam.  Remote lacunar infarcts are present within the thalami and basal ganglia bilaterally. No acute hemorrhage or mass lesion is present. Remote lacunar infarcts are noted within the brainstem.  Flow is present in the major intracranial arteries. Bilateral lens replacements are noted. The paranasal sinuses and the mastoid air cells are clear.  MRA HEAD FINDINGS  The internal carotid arteries are within normal limits from the high cervical segments through the ICA termini. The left A1 segment is aplastic. Both A2 segments fill from the right. The MCA bifurcations are intact. Mild distal small vessel attenuation is present throughout the anterior circulation.  Left vertebral artery is dominant. The PICA origins are visualized and normal basilar artery is within normal limits. The left posterior cerebral artery originates from the basilar tip. The right posterior cerebral artery is of fetal type. There is mild attenuation of distal PCA branch vessels, right greater than left. This is similar to the prior exam.  IMPRESSION: 1. No acute intracranial abnormality or significant interval change. 2. Remote anterior right frontal lobe infarct. 3. Stable advanced atrophy and diffuse white matter disease. 4. Remote lacunar infarcts involving the basal ganglia and brainstem. 5. The  MRA demonstrates stable distal small vessel disease without significant proximal stenosis, aneurysm, or branch vessel occlusion.   Electronically Signed   By: San Morelle M.D.   On: 09/26/2014 20:42   Mr Jodene Nam Head/brain Wo Cm  09/26/2014   CLINICAL DATA:  Confusion beginning last night. Difficulty finding words this morning. Persistent confusion.  EXAM: MRI HEAD WITHOUT CONTRAST  MRA HEAD WITHOUT CONTRAST  TECHNIQUE: Multiplanar, multiecho pulse sequences of the brain and surrounding structures were obtained without intravenous contrast. Angiographic images of the head were obtained using MRA technique without contrast.  COMPARISON:  CT head without contrast from the same day. MRI brain an MRA head 10/07/2010  FINDINGS: MRI HEAD FINDINGS  The diffusion-weighted images demonstrate no evidence for acute or subacute infarction. A remote right frontal lobe infarct is again noted. Advanced atrophy and extensive white matter changes are present bilaterally. Stents in dilated perivascular spaces are noted in the basal ganglia bilaterally. Confluent periventricular and subcortical white matter disease is similar to the prior exam.  Remote lacunar infarcts are present within the thalami and basal ganglia bilaterally. No acute hemorrhage or mass lesion is present.  Remote lacunar infarcts are noted within the brainstem.  Flow is present in the major intracranial arteries. Bilateral lens replacements are noted. The paranasal sinuses and the mastoid air cells are clear.  MRA HEAD FINDINGS  The internal carotid arteries are within normal limits from the high cervical segments through the ICA termini. The left A1 segment is aplastic. Both A2 segments fill from the right. The MCA bifurcations are intact. Mild distal small vessel attenuation is present throughout the anterior circulation.  Left vertebral artery is dominant. The PICA origins are visualized and normal basilar artery is within normal limits. The left  posterior cerebral artery originates from the basilar tip. The right posterior cerebral artery is of fetal type. There is mild attenuation of distal PCA branch vessels, right greater than left. This is similar to the prior exam.  IMPRESSION: 1. No acute intracranial abnormality or significant interval change. 2. Remote anterior right frontal lobe infarct. 3. Stable advanced atrophy and diffuse white matter disease. 4. Remote lacunar infarcts involving the basal ganglia and brainstem. 5. The MRA demonstrates stable distal small vessel disease without significant proximal stenosis, aneurysm, or branch vessel occlusion.   Electronically Signed   By: San Morelle M.D.   On: 09/26/2014 20:42   Labs: Basic Metabolic Panel:  Recent Labs Lab 09/26/14 1344 09/26/14 1429  NA 139 139  K 3.4* 3.4*  CL 104 101  CO2 23  --   GLUCOSE 218* 226*  BUN 17 20  CREATININE 1.13 1.00  CALCIUM 9.8  --    Liver Function Tests:  Recent Labs Lab 09/26/14 1344  AST 22  ALT 14  ALKPHOS 66  BILITOT 1.1  PROT 7.0  ALBUMIN 3.9   CBC:  Recent Labs Lab 09/26/14 1344 09/26/14 1429  WBC 8.6  --   NEUTROABS 6.5  --   HGB 16.1 16.3  HCT 45.5 48.0  MCV 86.7  --   PLT 212  --    CBG:  Recent Labs Lab 09/26/14 1524  GLUCAP 143*    Signed:  Barton Dubois  [facility] 09/27/2014, 12:04 PM

## 2014-09-28 LAB — HIV ANTIBODY (ROUTINE TESTING W REFLEX): HIV SCREEN 4TH GENERATION: NONREACTIVE

## 2014-09-28 LAB — RPR: RPR: NONREACTIVE

## 2014-09-29 LAB — HEMOGLOBIN A1C
Hgb A1c MFr Bld: 6.9 % — ABNORMAL HIGH (ref 4.8–5.6)
Mean Plasma Glucose: 151 mg/dL

## 2014-09-29 LAB — VITAMIN B12: Vitamin B-12: 426 pg/mL (ref 211–911)

## 2015-04-07 ENCOUNTER — Ambulatory Visit: Payer: Medicare Other | Admitting: Podiatry

## 2015-12-29 DIAGNOSIS — E7800 Pure hypercholesterolemia, unspecified: Secondary | ICD-10-CM | POA: Diagnosis not present

## 2015-12-29 DIAGNOSIS — I480 Paroxysmal atrial fibrillation: Secondary | ICD-10-CM | POA: Diagnosis not present

## 2015-12-29 DIAGNOSIS — Z1389 Encounter for screening for other disorder: Secondary | ICD-10-CM | POA: Diagnosis not present

## 2015-12-29 DIAGNOSIS — Z0000 Encounter for general adult medical examination without abnormal findings: Secondary | ICD-10-CM | POA: Diagnosis not present

## 2015-12-29 DIAGNOSIS — E1121 Type 2 diabetes mellitus with diabetic nephropathy: Secondary | ICD-10-CM | POA: Diagnosis not present

## 2015-12-29 DIAGNOSIS — B351 Tinea unguium: Secondary | ICD-10-CM | POA: Diagnosis not present

## 2015-12-29 DIAGNOSIS — I10 Essential (primary) hypertension: Secondary | ICD-10-CM | POA: Diagnosis not present

## 2015-12-29 DIAGNOSIS — E0840 Diabetes mellitus due to underlying condition with diabetic neuropathy, unspecified: Secondary | ICD-10-CM | POA: Diagnosis not present

## 2015-12-29 DIAGNOSIS — N182 Chronic kidney disease, stage 2 (mild): Secondary | ICD-10-CM | POA: Diagnosis not present

## 2015-12-29 DIAGNOSIS — G459 Transient cerebral ischemic attack, unspecified: Secondary | ICD-10-CM | POA: Diagnosis not present

## 2016-01-13 DIAGNOSIS — L03211 Cellulitis of face: Secondary | ICD-10-CM | POA: Diagnosis not present

## 2016-01-15 ENCOUNTER — Ambulatory Visit: Payer: Medicare Other | Admitting: Podiatry

## 2016-04-06 DIAGNOSIS — Z23 Encounter for immunization: Secondary | ICD-10-CM | POA: Diagnosis not present

## 2016-06-23 DIAGNOSIS — G459 Transient cerebral ischemic attack, unspecified: Secondary | ICD-10-CM | POA: Diagnosis not present

## 2016-06-23 DIAGNOSIS — E1121 Type 2 diabetes mellitus with diabetic nephropathy: Secondary | ICD-10-CM | POA: Diagnosis not present

## 2016-06-23 DIAGNOSIS — E1140 Type 2 diabetes mellitus with diabetic neuropathy, unspecified: Secondary | ICD-10-CM | POA: Diagnosis not present

## 2016-06-23 DIAGNOSIS — E7800 Pure hypercholesterolemia, unspecified: Secondary | ICD-10-CM | POA: Diagnosis not present

## 2016-06-23 DIAGNOSIS — I10 Essential (primary) hypertension: Secondary | ICD-10-CM | POA: Diagnosis not present

## 2016-06-23 DIAGNOSIS — N182 Chronic kidney disease, stage 2 (mild): Secondary | ICD-10-CM | POA: Diagnosis not present

## 2016-06-23 DIAGNOSIS — R413 Other amnesia: Secondary | ICD-10-CM | POA: Diagnosis not present

## 2016-06-23 DIAGNOSIS — I480 Paroxysmal atrial fibrillation: Secondary | ICD-10-CM | POA: Diagnosis not present

## 2016-06-23 DIAGNOSIS — Z7984 Long term (current) use of oral hypoglycemic drugs: Secondary | ICD-10-CM | POA: Diagnosis not present

## 2016-07-06 DIAGNOSIS — L03211 Cellulitis of face: Secondary | ICD-10-CM | POA: Diagnosis not present

## 2016-08-23 DIAGNOSIS — E119 Type 2 diabetes mellitus without complications: Secondary | ICD-10-CM | POA: Diagnosis not present

## 2016-08-23 DIAGNOSIS — H02831 Dermatochalasis of right upper eyelid: Secondary | ICD-10-CM | POA: Diagnosis not present

## 2016-08-23 DIAGNOSIS — H35373 Puckering of macula, bilateral: Secondary | ICD-10-CM | POA: Diagnosis not present

## 2016-08-23 DIAGNOSIS — Z961 Presence of intraocular lens: Secondary | ICD-10-CM | POA: Diagnosis not present

## 2016-09-21 DIAGNOSIS — I10 Essential (primary) hypertension: Secondary | ICD-10-CM | POA: Diagnosis not present

## 2016-09-21 DIAGNOSIS — K219 Gastro-esophageal reflux disease without esophagitis: Secondary | ICD-10-CM | POA: Diagnosis not present

## 2016-09-21 DIAGNOSIS — E1140 Type 2 diabetes mellitus with diabetic neuropathy, unspecified: Secondary | ICD-10-CM | POA: Diagnosis not present

## 2016-09-21 DIAGNOSIS — E1121 Type 2 diabetes mellitus with diabetic nephropathy: Secondary | ICD-10-CM | POA: Diagnosis not present

## 2016-12-28 DIAGNOSIS — Z0000 Encounter for general adult medical examination without abnormal findings: Secondary | ICD-10-CM | POA: Diagnosis not present

## 2016-12-28 DIAGNOSIS — I10 Essential (primary) hypertension: Secondary | ICD-10-CM | POA: Diagnosis not present

## 2016-12-28 DIAGNOSIS — E1140 Type 2 diabetes mellitus with diabetic neuropathy, unspecified: Secondary | ICD-10-CM | POA: Diagnosis not present

## 2016-12-28 DIAGNOSIS — E1121 Type 2 diabetes mellitus with diabetic nephropathy: Secondary | ICD-10-CM | POA: Diagnosis not present

## 2017-01-17 ENCOUNTER — Encounter: Payer: Self-pay | Admitting: Podiatry

## 2017-01-17 ENCOUNTER — Ambulatory Visit (INDEPENDENT_AMBULATORY_CARE_PROVIDER_SITE_OTHER): Payer: Medicare Other | Admitting: Podiatry

## 2017-01-17 VITALS — BP 140/79 | HR 56 | Resp 18

## 2017-01-17 DIAGNOSIS — B351 Tinea unguium: Secondary | ICD-10-CM

## 2017-01-17 DIAGNOSIS — G609 Hereditary and idiopathic neuropathy, unspecified: Secondary | ICD-10-CM

## 2017-01-17 NOTE — Progress Notes (Signed)
   Subjective:    Patient ID: Derrick Ross, male    DOB: 17-Jun-1931, 83 y.o.   MRN: [id number]  HPI This patient presents today complaining that his toenails are long and thick and is unable to trim the toenails himself. Patient describes the nails become progressively more deformed over the last 6 months. Patient denies any other treatment other than self treatment.  Patient denies smoking history   Review of Systems  Skin:       Change in nails   All other systems reviewed and are negative.      Objective:   Physical Exam   Orientated 3  Vascular: Peripheral pitting edema bilaterally DP and PT pulses 2/4 bilaterally Capillary reflex media bilaterally  Neurological: Sensation to 10 g monofilament wire intact 0/5 bilaterally Vibratory sensation reactive bilaterally Ankle reflex reactive bilaterally  Dermatological: No open skin lesions bilaterally Dry, scaling, atrophic skin with absent hair growth bilaterally The toenails are elongated, brittle, hypertrophic, discolored 6-10  Musculoskeletal: Hammertoe second bilaterally HAV left Manual motor testing dorsi flexion, plantar flexion 5/5 bilaterally     Assessment & Plan:   Assessment: Neglected mycotic toenails 6-10 Idiopathic peripheral neuropathy  Plan: Debridement of toenails 6-10 mechanically an electrical without any bleeding

## 2017-02-22 DIAGNOSIS — C44212 Basal cell carcinoma of skin of right ear and external auricular canal: Secondary | ICD-10-CM | POA: Diagnosis not present

## 2017-02-22 DIAGNOSIS — D225 Melanocytic nevi of trunk: Secondary | ICD-10-CM | POA: Diagnosis not present

## 2017-02-22 DIAGNOSIS — L218 Other seborrheic dermatitis: Secondary | ICD-10-CM | POA: Diagnosis not present

## 2017-02-22 DIAGNOSIS — D485 Neoplasm of uncertain behavior of skin: Secondary | ICD-10-CM | POA: Diagnosis not present

## 2017-02-22 DIAGNOSIS — C44319 Basal cell carcinoma of skin of other parts of face: Secondary | ICD-10-CM | POA: Diagnosis not present

## 2017-02-22 DIAGNOSIS — D0359 Melanoma in situ of other part of trunk: Secondary | ICD-10-CM | POA: Diagnosis not present

## 2017-02-22 DIAGNOSIS — L570 Actinic keratosis: Secondary | ICD-10-CM | POA: Diagnosis not present

## 2017-03-07 DIAGNOSIS — L988 Other specified disorders of the skin and subcutaneous tissue: Secondary | ICD-10-CM | POA: Diagnosis not present

## 2017-03-07 DIAGNOSIS — D0359 Melanoma in situ of other part of trunk: Secondary | ICD-10-CM | POA: Diagnosis not present

## 2017-03-29 DIAGNOSIS — Z85828 Personal history of other malignant neoplasm of skin: Secondary | ICD-10-CM | POA: Diagnosis not present

## 2017-03-29 DIAGNOSIS — C44319 Basal cell carcinoma of skin of other parts of face: Secondary | ICD-10-CM | POA: Diagnosis not present

## 2017-04-06 DIAGNOSIS — Z85828 Personal history of other malignant neoplasm of skin: Secondary | ICD-10-CM | POA: Diagnosis not present

## 2017-04-06 DIAGNOSIS — C44212 Basal cell carcinoma of skin of right ear and external auricular canal: Secondary | ICD-10-CM | POA: Diagnosis not present

## 2017-04-20 DIAGNOSIS — Z23 Encounter for immunization: Secondary | ICD-10-CM | POA: Diagnosis not present

## 2017-04-24 ENCOUNTER — Ambulatory Visit (HOSPITAL_COMMUNITY)
Admission: EM | Admit: 2017-04-24 | Discharge: 2017-04-24 | Disposition: A | Discharge status: Home / Self Care | Payer: Medicare Other | Attending: Internal Medicine | Admitting: Internal Medicine

## 2017-04-24 ENCOUNTER — Encounter (HOSPITAL_COMMUNITY): Payer: Self-pay | Admitting: Emergency Medicine

## 2017-04-24 DIAGNOSIS — S0101XA Laceration without foreign body of scalp, initial encounter: Secondary | ICD-10-CM

## 2017-04-24 MED ORDER — LIDOCAINE HCL (PF) 2 % IJ SOLN
INTRAMUSCULAR | Status: AC
  Filled 2017-04-24: qty 2

## 2017-04-24 MED ORDER — CEPHALEXIN 500 MG PO CAPS: 500.0000 mg | ORAL_CAPSULE | Freq: Three times a day (TID) | ORAL | 0 refills | Status: AC

## 2017-04-24 NOTE — ED Triage Notes (Signed)
Pt sts rolled out of bed this am and hit head on metal; pt sts takes coumadin and small laceration noted with bleeding controlled

## 2017-04-24 NOTE — ED Notes (Signed)
Patient discharged by provider.

## 2017-05-02 ENCOUNTER — Encounter (HOSPITAL_COMMUNITY): Payer: Self-pay | Admitting: Emergency Medicine

## 2017-05-02 ENCOUNTER — Ambulatory Visit (HOSPITAL_COMMUNITY)
Admission: EM | Admit: 2017-05-02 | Discharge: 2017-05-02 | Disposition: A | Discharge status: Home / Self Care | Payer: Medicare Other

## 2017-05-02 DIAGNOSIS — Z4802 Encounter for removal of sutures: Secondary | ICD-10-CM

## 2017-05-02 NOTE — ED Triage Notes (Signed)
Pt here for suture removal

## 2017-07-04 DIAGNOSIS — I480 Paroxysmal atrial fibrillation: Secondary | ICD-10-CM | POA: Diagnosis not present

## 2017-07-04 DIAGNOSIS — E1140 Type 2 diabetes mellitus with diabetic neuropathy, unspecified: Secondary | ICD-10-CM | POA: Diagnosis not present

## 2017-07-04 DIAGNOSIS — E1121 Type 2 diabetes mellitus with diabetic nephropathy: Secondary | ICD-10-CM | POA: Diagnosis not present

## 2017-07-04 DIAGNOSIS — I10 Essential (primary) hypertension: Secondary | ICD-10-CM | POA: Diagnosis not present

## 2017-08-25 DIAGNOSIS — M1712 Unilateral primary osteoarthritis, left knee: Secondary | ICD-10-CM | POA: Diagnosis not present

## 2017-08-25 DIAGNOSIS — M1710 Unilateral primary osteoarthritis, unspecified knee: Secondary | ICD-10-CM | POA: Insufficient documentation

## 2017-08-25 DIAGNOSIS — M179 Osteoarthritis of knee, unspecified: Secondary | ICD-10-CM | POA: Insufficient documentation

## 2017-09-04 ENCOUNTER — Other Ambulatory Visit: Payer: Self-pay | Admitting: Internal Medicine

## 2017-09-04 ENCOUNTER — Ambulatory Visit
Admission: RE | Admit: 2017-09-04 | Discharge: 2017-09-04 | Disposition: A | Discharge status: Home / Self Care | Payer: Medicare Other | Source: Ambulatory Visit | Attending: Internal Medicine | Admitting: Internal Medicine

## 2017-09-04 DIAGNOSIS — M25551 Pain in right hip: Secondary | ICD-10-CM

## 2017-09-04 DIAGNOSIS — S7001XA Contusion of right hip, initial encounter: Secondary | ICD-10-CM | POA: Diagnosis not present

## 2017-09-13 DIAGNOSIS — M13851 Other specified arthritis, right hip: Secondary | ICD-10-CM | POA: Diagnosis not present

## 2017-11-01 DIAGNOSIS — M1712 Unilateral primary osteoarthritis, left knee: Secondary | ICD-10-CM | POA: Diagnosis not present

## 2017-11-09 DIAGNOSIS — M1712 Unilateral primary osteoarthritis, left knee: Secondary | ICD-10-CM | POA: Diagnosis not present

## 2017-11-17 DIAGNOSIS — M1712 Unilateral primary osteoarthritis, left knee: Secondary | ICD-10-CM | POA: Diagnosis not present

## 2017-12-20 DIAGNOSIS — H01022 Squamous blepharitis right lower eyelid: Secondary | ICD-10-CM | POA: Diagnosis not present

## 2017-12-20 DIAGNOSIS — H01021 Squamous blepharitis right upper eyelid: Secondary | ICD-10-CM | POA: Diagnosis not present

## 2017-12-20 DIAGNOSIS — H35373 Puckering of macula, bilateral: Secondary | ICD-10-CM | POA: Diagnosis not present

## 2017-12-20 DIAGNOSIS — E119 Type 2 diabetes mellitus without complications: Secondary | ICD-10-CM | POA: Diagnosis not present

## 2018-01-02 DIAGNOSIS — Z0000 Encounter for general adult medical examination without abnormal findings: Secondary | ICD-10-CM | POA: Diagnosis not present

## 2018-01-02 DIAGNOSIS — E1140 Type 2 diabetes mellitus with diabetic neuropathy, unspecified: Secondary | ICD-10-CM | POA: Diagnosis not present

## 2018-01-02 DIAGNOSIS — I10 Essential (primary) hypertension: Secondary | ICD-10-CM | POA: Diagnosis not present

## 2018-01-02 DIAGNOSIS — Z1389 Encounter for screening for other disorder: Secondary | ICD-10-CM | POA: Diagnosis not present

## 2018-01-02 DIAGNOSIS — I480 Paroxysmal atrial fibrillation: Secondary | ICD-10-CM | POA: Diagnosis not present

## 2018-01-02 DIAGNOSIS — E1121 Type 2 diabetes mellitus with diabetic nephropathy: Secondary | ICD-10-CM | POA: Diagnosis not present

## 2018-01-02 DIAGNOSIS — Z7189 Other specified counseling: Secondary | ICD-10-CM | POA: Diagnosis not present

## 2018-02-02 ENCOUNTER — Encounter: Payer: Self-pay | Admitting: Podiatry

## 2018-02-02 ENCOUNTER — Ambulatory Visit: Payer: Medicare Other | Admitting: Podiatry

## 2018-02-02 ENCOUNTER — Other Ambulatory Visit: Payer: Self-pay

## 2018-02-02 DIAGNOSIS — L84 Corns and callosities: Secondary | ICD-10-CM | POA: Diagnosis not present

## 2018-02-02 DIAGNOSIS — M79675 Pain in left toe(s): Secondary | ICD-10-CM

## 2018-02-02 DIAGNOSIS — B353 Tinea pedis: Secondary | ICD-10-CM

## 2018-02-02 DIAGNOSIS — G609 Hereditary and idiopathic neuropathy, unspecified: Secondary | ICD-10-CM | POA: Diagnosis not present

## 2018-02-02 DIAGNOSIS — B351 Tinea unguium: Secondary | ICD-10-CM | POA: Diagnosis not present

## 2018-02-02 DIAGNOSIS — M79674 Pain in right toe(s): Secondary | ICD-10-CM | POA: Diagnosis not present

## 2018-02-02 MED ORDER — KETOCONAZOLE 2 % EX CREA: TOPICAL_CREAM | Freq: Every day | CUTANEOUS | Status: DC

## 2018-02-04 MED ORDER — KETOCONAZOLE 2 % EX CREA: 1.0000 "application " | TOPICAL_CREAM | Freq: Every day | CUTANEOUS | 0 refills | Status: DC

## 2018-02-04 NOTE — Progress Notes (Signed)
Subjective: Derrick Ross  presents today for preventative foot care. He complains of painful, discolored, thick toenails which interfere with activities of daily living. Pain is aggravated when wearing enclosed shoe gear. Pain is getting progressively worse and relieved with periodic professional debridement.  Objective: Physical Exam Orientated 3  Vascular: Peripheral pitting edema bilaterally DP and PT pulses 2/4 bilaterally Capillary reflex immediate bilaterally  Neurological: Sensation to 10 g monofilament wire intact 0/5 bilaterally Vibratory sensation reactive bilaterally  Dermatological: Dry, scaling, atrophic skin with absent hair growth bilaterally Toenails are elongated, brittle, hypertrophic, discolored x 10 Hyperkeratotic lesion distal tip right 3rd digit.  Musculoskeletal: Hammertoe second bilaterally HAV left Manual motor testing dorsi flexion, plantar flexion 5/5 bilaterally  Assessment: 1. Painful onychomycosis toenails 1-5 b/l  2. Corn distal tip right 3rd toe 2. Tinea pedis b/l 3. Idiopathic peripheral neuropathy  Plan: 1. Toenails 1-5 b/l were debrided in length and girth without iatrogenic bleeding. 2. Corn debrided right 3rd digit. 3. Discussed tinea pedis and topical treatment options. Rx written for  4. Patient to continue soft, supportive shoe gear 5. Patient to report any pedal injuries to medical professional immediately. 6. Follow up 3 months. Patient/POA to call should there be a concern in the interim.

## 2018-02-05 ENCOUNTER — Telehealth: Payer: Self-pay | Admitting: *Deleted

## 2018-02-05 MED ORDER — KETOCONAZOLE 2 % EX CREA: 1.0000 "application " | TOPICAL_CREAM | Freq: Every day | CUTANEOUS | 0 refills | Status: AC

## 2018-02-05 NOTE — Telephone Encounter (Signed)
Ketoconazole cream reordered due to error on 02/04/2018.

## 2018-04-18 DIAGNOSIS — Z23 Encounter for immunization: Secondary | ICD-10-CM | POA: Diagnosis not present

## 2018-05-02 DIAGNOSIS — H903 Sensorineural hearing loss, bilateral: Secondary | ICD-10-CM | POA: Diagnosis not present

## 2018-05-07 ENCOUNTER — Other Ambulatory Visit: Payer: Self-pay

## 2018-05-07 ENCOUNTER — Ambulatory Visit (INDEPENDENT_AMBULATORY_CARE_PROVIDER_SITE_OTHER): Payer: Medicare Other | Admitting: Podiatry

## 2018-05-07 DIAGNOSIS — B351 Tinea unguium: Secondary | ICD-10-CM

## 2018-05-07 DIAGNOSIS — M79674 Pain in right toe(s): Secondary | ICD-10-CM

## 2018-05-07 DIAGNOSIS — L84 Corns and callosities: Secondary | ICD-10-CM | POA: Diagnosis not present

## 2018-05-07 DIAGNOSIS — G609 Hereditary and idiopathic neuropathy, unspecified: Secondary | ICD-10-CM | POA: Diagnosis not present

## 2018-05-07 DIAGNOSIS — M79675 Pain in left toe(s): Secondary | ICD-10-CM

## 2018-05-07 DIAGNOSIS — B353 Tinea pedis: Secondary | ICD-10-CM | POA: Diagnosis not present

## 2018-05-10 ENCOUNTER — Telehealth: Payer: Self-pay | Admitting: Podiatry

## 2018-05-10 NOTE — Telephone Encounter (Signed)
Pt wife called and needs a new prescription for the topical cream for his feet. [facility] at [facility].

## 2018-05-10 NOTE — Telephone Encounter (Signed)
Left message for [name] to call with the name of the cream requested.

## 2018-05-11 ENCOUNTER — Other Ambulatory Visit: Payer: Self-pay | Admitting: Podiatry

## 2018-05-11 NOTE — Telephone Encounter (Signed)
Left message with orders for ketoconazole 2% cream.

## 2018-05-15 ENCOUNTER — Encounter: Payer: Self-pay | Admitting: Podiatry

## 2018-05-15 NOTE — Progress Notes (Signed)
Subjective: Derrick Ross presents today with history of neuropathy with cc of painful, mycotic toenails.  Pain is aggravated when wearing enclosed shoe gear and relieved with periodic professional debridement.  Patient's wife waited in  reception area. Wife informs CMA that antifungal cream prescribed on last visit was picked up from pharmacy, but was never used.  Objective:  Vascular Examination: Capillary refill time  Dorsalis pedis and  Posterior tibial pulses Digital hair x 10 digits was  Skin temperature   Dermatological Examination: Skin with diffuse scaling with no improvement noted b/l feet plantarly and peripherally. Toenails 1-5 b/l discolored, thick, dystrophic with subungual debris and pain with palpation to nailbeds due to thickness of nails. Hyperkeratotic lesion distal tip right 3rd digit   Musculoskeletal: Muscle strength 5/5 b/l Hammertoe 2nd digit b/l HAV b/l   Neurological: Sensation with 10 gram monofilament. Vibratory sensation   Assessment: 1. Painful onychomycosis toenails 1-5 b/l 2. Tinea pedis b/l 3. Corn distal tip right 3rd digit 4. Idiopathic peripheral neuropathy  Plan: 1. Per patient's wife, they never used last prescription and will start today. Apply Ketoconazole Cream 2% to both feet and between toes once daily x 6 weeks for tinea pedis. 2. Toenails 1-5 b/l were debrided in length and girth without iatrogenic bleeding. 3. Corn debrided right 3rd digit 4. Patient to continue soft, supportive shoe gear 5. Patient to report any pedal injuries to medical professional  6. Follow up 10 weeks. Patient/POA to call should there be a concern in the interim.

## 2018-05-22 DIAGNOSIS — H903 Sensorineural hearing loss, bilateral: Secondary | ICD-10-CM | POA: Diagnosis not present

## 2018-05-30 DIAGNOSIS — M1712 Unilateral primary osteoarthritis, left knee: Secondary | ICD-10-CM | POA: Diagnosis not present

## 2018-06-06 DIAGNOSIS — M1712 Unilateral primary osteoarthritis, left knee: Secondary | ICD-10-CM | POA: Diagnosis not present

## 2018-06-13 DIAGNOSIS — M1712 Unilateral primary osteoarthritis, left knee: Secondary | ICD-10-CM | POA: Diagnosis not present

## 2018-07-05 DIAGNOSIS — E1140 Type 2 diabetes mellitus with diabetic neuropathy, unspecified: Secondary | ICD-10-CM | POA: Diagnosis not present

## 2018-07-05 DIAGNOSIS — I10 Essential (primary) hypertension: Secondary | ICD-10-CM | POA: Diagnosis not present

## 2018-07-05 DIAGNOSIS — E1121 Type 2 diabetes mellitus with diabetic nephropathy: Secondary | ICD-10-CM | POA: Diagnosis not present

## 2018-07-05 DIAGNOSIS — I480 Paroxysmal atrial fibrillation: Secondary | ICD-10-CM | POA: Diagnosis not present

## 2018-07-16 ENCOUNTER — Ambulatory Visit: Payer: Medicare Other | Admitting: Podiatry

## 2018-08-04 IMAGING — DX DG HIP (WITH OR WITHOUT PELVIS) 2-3V*R*
2 series · 2 of 2 positions shown · non-contrast
Comparison: None.

CLINICAL DATA: Right hip bruising, no known injury, initial
encounter

EXAM:
DG HIP (WITH OR WITHOUT PELVIS) 2V RIGHT

[dg hip unilat w or w/o pelvis 2-3 views  (1 of 2)]
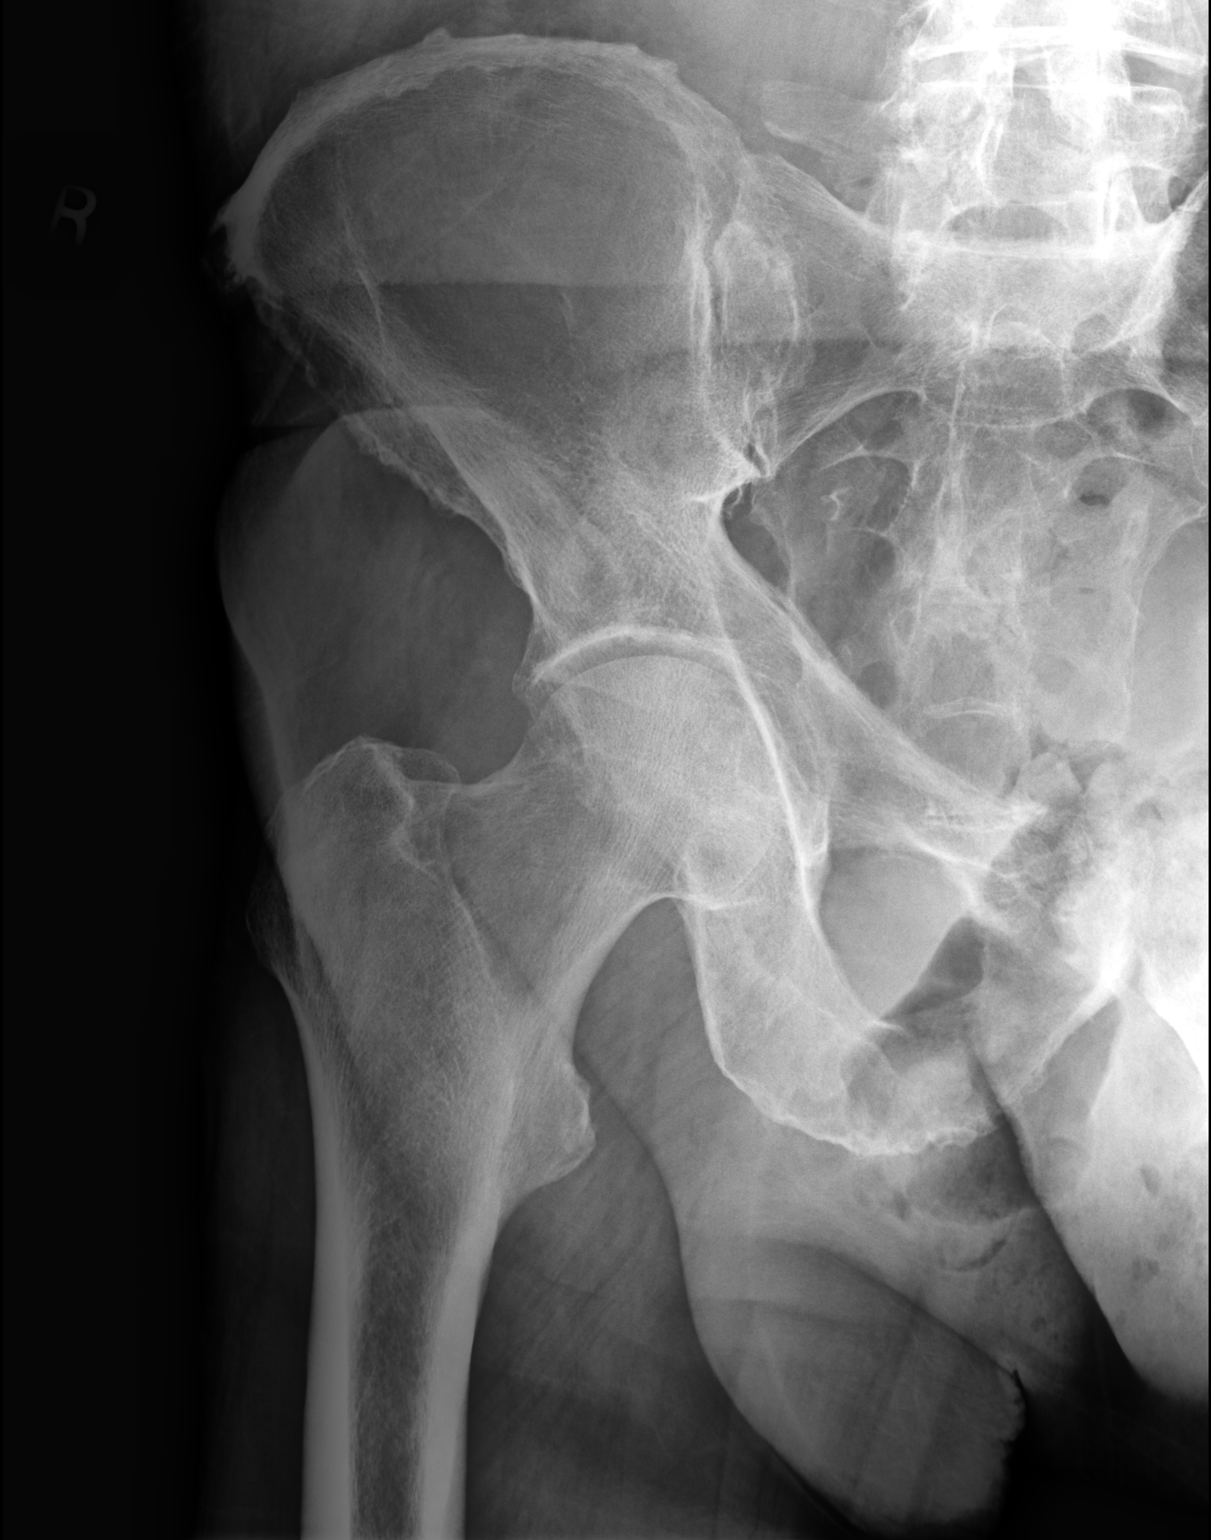

[dg hip unilat w or w/o pelvis 2-3 views  (2 of 2)]
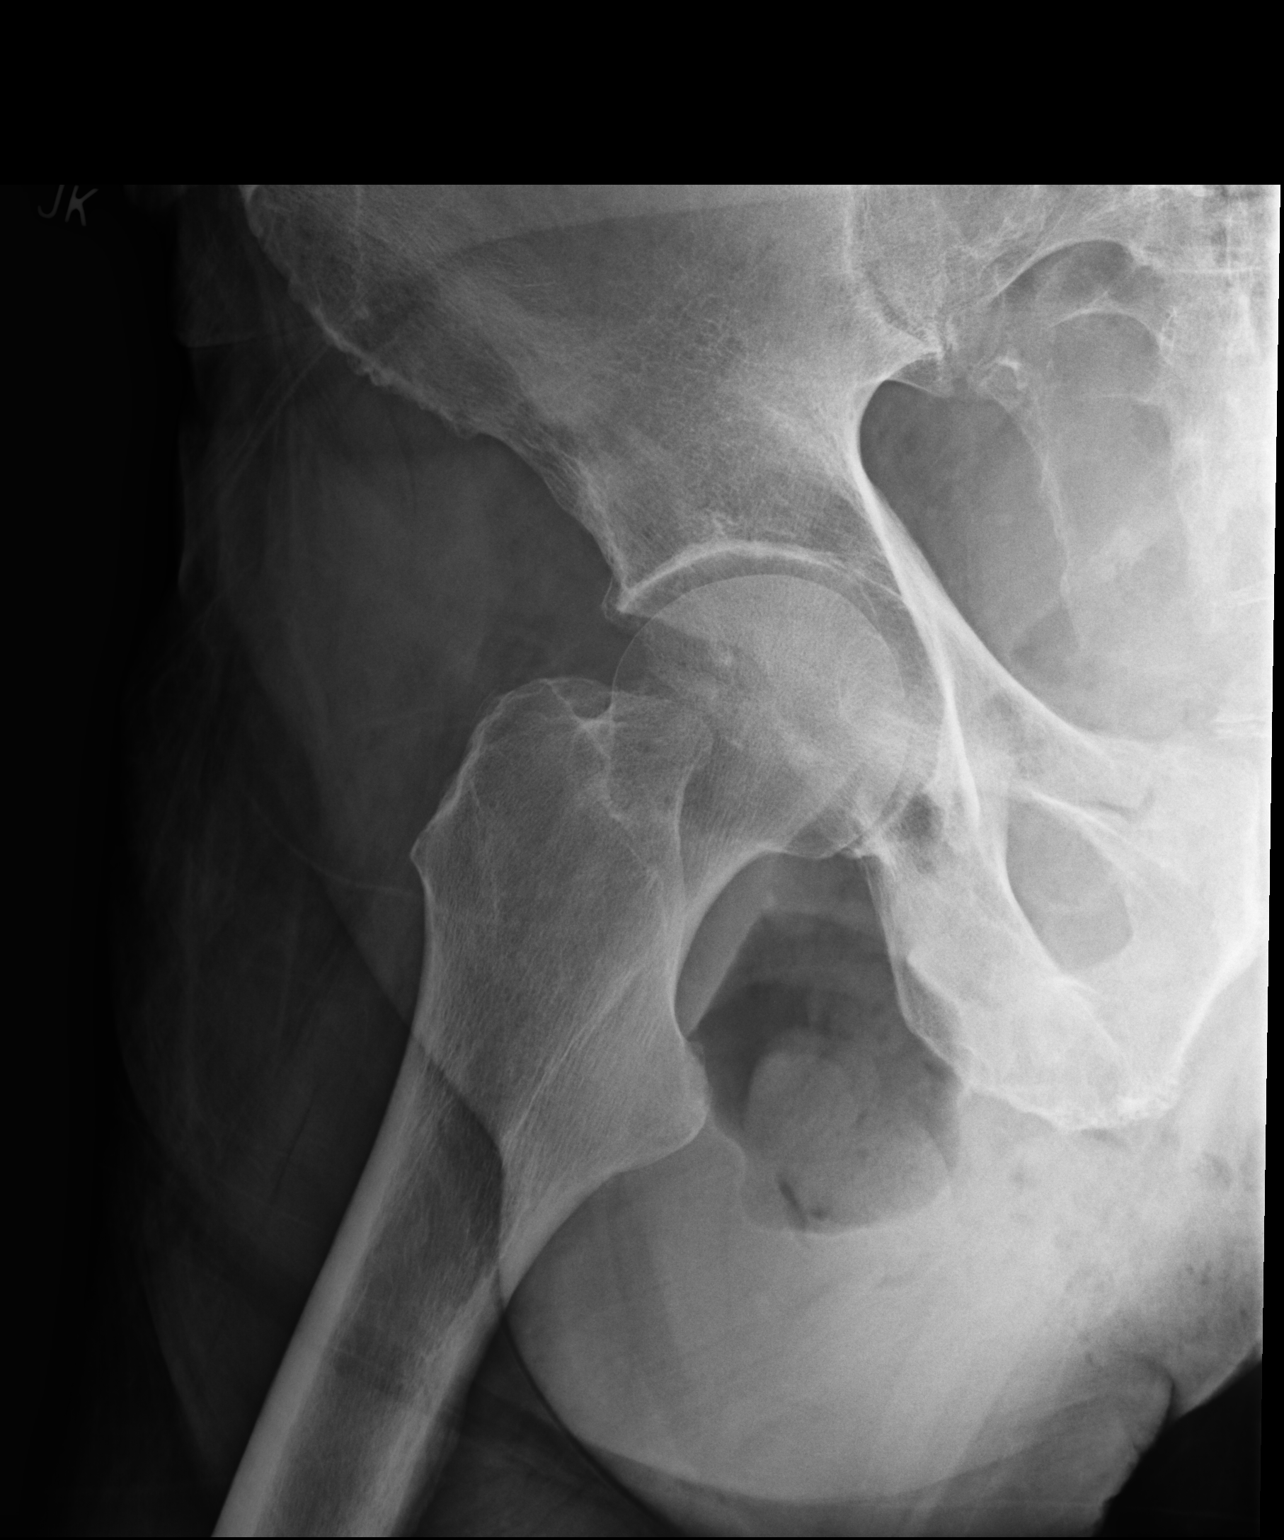

[2 of 2 positions shown; findings below may reference images not displayed]

FINDINGS: There is no evidence of hip fracture or dislocation. There is no
evidence of arthropathy or other focal bone abnormality.
IMPRESSION: No acute abnormality noted.

## 2019-01-09 DIAGNOSIS — I10 Essential (primary) hypertension: Secondary | ICD-10-CM | POA: Diagnosis not present

## 2019-01-09 DIAGNOSIS — E1121 Type 2 diabetes mellitus with diabetic nephropathy: Secondary | ICD-10-CM | POA: Diagnosis not present

## 2019-01-09 DIAGNOSIS — E7800 Pure hypercholesterolemia, unspecified: Secondary | ICD-10-CM | POA: Diagnosis not present

## 2019-01-09 DIAGNOSIS — N182 Chronic kidney disease, stage 2 (mild): Secondary | ICD-10-CM | POA: Diagnosis not present

## 2019-01-14 DIAGNOSIS — E7800 Pure hypercholesterolemia, unspecified: Secondary | ICD-10-CM | POA: Diagnosis not present

## 2019-01-14 DIAGNOSIS — I10 Essential (primary) hypertension: Secondary | ICD-10-CM | POA: Diagnosis not present

## 2019-01-14 DIAGNOSIS — E1121 Type 2 diabetes mellitus with diabetic nephropathy: Secondary | ICD-10-CM | POA: Diagnosis not present

## 2019-01-14 DIAGNOSIS — Z7984 Long term (current) use of oral hypoglycemic drugs: Secondary | ICD-10-CM | POA: Diagnosis not present

## 2019-02-08 ENCOUNTER — Encounter: Payer: Self-pay | Admitting: Podiatry

## 2019-02-08 ENCOUNTER — Other Ambulatory Visit: Payer: Self-pay

## 2019-02-08 ENCOUNTER — Ambulatory Visit (INDEPENDENT_AMBULATORY_CARE_PROVIDER_SITE_OTHER): Payer: Medicare Other | Admitting: Podiatry

## 2019-02-08 DIAGNOSIS — M79675 Pain in left toe(s): Secondary | ICD-10-CM | POA: Diagnosis not present

## 2019-02-08 DIAGNOSIS — B351 Tinea unguium: Secondary | ICD-10-CM

## 2019-02-08 DIAGNOSIS — G609 Hereditary and idiopathic neuropathy, unspecified: Secondary | ICD-10-CM | POA: Diagnosis not present

## 2019-02-08 DIAGNOSIS — Q828 Other specified congenital malformations of skin: Secondary | ICD-10-CM

## 2019-02-08 DIAGNOSIS — M79674 Pain in right toe(s): Secondary | ICD-10-CM

## 2019-02-08 NOTE — Patient Instructions (Signed)

## 2019-02-11 NOTE — Progress Notes (Signed)
Subjective:  Derrick Ross presents to clinic today with cc of  painful, thick, discolored, elongated toenails 1-5 b/l that become tender and cannot cut because of thickness.  Pain is aggravated when wearing enclosed shoe gear.  Wenda Low, MD is his PCP.    Current Outpatient Medications:  .  acetaminophen (TYLENOL) 500 MG tablet, Take 500 mg by mouth every 6 (six) hours as needed., Disp: , Rfl:  .  amLODipine (NORVASC) 10 MG tablet, TK 1 T PO ONCE D, Disp: , Rfl: 3 .  amLODipine (NORVASC) 5 MG tablet, Take 5 mg by mouth daily., Disp: , Rfl:  .  apixaban (ELIQUIS) 2.5 MG TABS tablet, Eliquis, Disp: , Rfl:  .  apixaban (ELIQUIS) 5 MG TABS tablet, Take 1 tablet (5 mg total) by mouth 2 (two) times daily. Take first dose at 10pm tonight and then 8:00 and 8:00, Disp: 60 tablet, Rfl: 1 .  atenolol (TENORMIN) 100 MG tablet, Take 100 mg by mouth daily., Disp: , Rfl:  .  benazepril (LOTENSIN) 40 MG tablet, Take 40 mg by mouth daily., Disp: , Rfl:  .  cloNIDine (CATAPRES) 0.2 MG tablet, Take 0.2 mg by mouth 2 (two) times daily., Disp: , Rfl:  .  Coenzyme Q10 (CO Q10 PO), Take by mouth., Disp: , Rfl:  .  donepezil (ARICEPT) 5 MG tablet, TAKE 1 TABLET BY MOUTH ONCE DAILY AT BEDTIME FOR 30 DAYS, Disp: , Rfl: 12 .  hydrochlorothiazide (HYDRODIURIL) 25 MG tablet, Take 50 mg by mouth daily. Take 2 tablets once a day for a total of 50 mg., Disp: , Rfl:  .  ketoconazole (NIZORAL) 2 % cream,  APPLY ONE APPLICATION TOPICALLY DAILY , APPLY TO BOTH FEET AND BETWEEN TOES ONCE DAILY FOR 30 DAYS, Disp: 30 g, Rfl: 0 .  memantine (NAMENDA) 10 MG tablet, TAKE 1 TABLET BY MOUTH TWICE DAILY FOR 30 DAYS, Disp: , Rfl: 12 .  memantine (NAMENDA) 5 MG tablet, Take 5 mg by mouth 2 (two) times daily., Disp: , Rfl:  .  metFORMIN (GLUCOPHAGE) 500 MG tablet, TK 1 T PO BID WITH MEALS, Disp: , Rfl: 2 .  potassium chloride SA (K-DUR,KLOR-CON) 20 MEQ tablet, Take 20 mEq by mouth 2 (two) times daily., Disp: , Rfl:  .  Prenatal  Vit-Fe Fumarate-FA (M-VIT PO), Take 1 tablet by mouth daily., Disp: , Rfl:  .  ranitidine (ZANTAC) 75 MG tablet, Take 75 mg by mouth daily., Disp: , Rfl:  .  simvastatin (ZOCOR) 20 MG tablet, Take 20 mg by mouth daily., Disp: , Rfl:  .  traMADol (ULTRAM) 50 MG tablet, tramadol 50 mg tablet, Disp: , Rfl:    No Known Allergies   Objective: There were no vitals filed for this visit.  Physical Examination:  Vascular Examination: Capillary refill time immediate x 10 digits.  Palpable DP/PT pulses b/l.  Digital hair absent b/l.   No edema noted b/l.  Skin temperature gradient WNL b/l.  Dermatological Examination: Skin thin and atrophic b/l  No open wounds b/l.  No interdigital macerations noted b/l.  Elongated, thick, discolored brittle toenails with subungual debris and pain on dorsal palpation of nailbeds 1-5 b/l.  Porokeratosis distal tip right 3rd digit pared and enucleated with sterile scalpel blade without incident.  Musculoskeletal Examination: Muscle strength 5/5 to all muscle groups b/l.  HAV with bunion left foot.  Hammertoe 2nd digit b/l.  No pain, crepitus or joint discomfort with active/passive ROM.  Neurological Examination: Sensation intact 5/5 b/l with  10 gram monofilament.  Vibratory sensation intact b/l.  Assessment: Mycotic nail infection with pain 1-5 b/l Porokeratosis distal tip right 3rd digit Idiopathic PN HAV with bunion left foot Hammertoe deformity b/l 2nd digit  Plan: 1. Toenails 1-5 b/l were debrided in length and girth without iatrogenic laceration. Porokeratosis right 3rd digit pared and enucleated with sterile scalpel blade without incident. Continue soft, supportive shoe gear daily. Report any pedal injuries to medical professional. Follow up 3 months. Patient/POA to call should there be a question/concern in there interim.

## 2019-04-16 DIAGNOSIS — I480 Paroxysmal atrial fibrillation: Secondary | ICD-10-CM | POA: Diagnosis not present

## 2019-04-16 DIAGNOSIS — E1121 Type 2 diabetes mellitus with diabetic nephropathy: Secondary | ICD-10-CM | POA: Diagnosis not present

## 2019-04-16 DIAGNOSIS — E1140 Type 2 diabetes mellitus with diabetic neuropathy, unspecified: Secondary | ICD-10-CM | POA: Diagnosis not present

## 2019-04-16 DIAGNOSIS — I10 Essential (primary) hypertension: Secondary | ICD-10-CM | POA: Diagnosis not present

## 2019-05-14 ENCOUNTER — Ambulatory Visit: Payer: Medicare Other | Admitting: Podiatry

## 2019-08-11 ENCOUNTER — Ambulatory Visit: Payer: Medicare Other | Attending: Internal Medicine

## 2019-08-11 DIAGNOSIS — Z23 Encounter for immunization: Secondary | ICD-10-CM | POA: Insufficient documentation

## 2019-08-11 NOTE — Progress Notes (Signed)
   Covid-19 Vaccination Clinic  Name:  VATCHE BERMAN    MRN: [id number] DOB: 1930-11-12  08/11/2019  Mr. Tivnan was observed post Covid-19 immunization for 15 minutes without incidence. He was provided with Vaccine Information Sheet and instruction to access the V-Safe system.   Mr. Silverstone was instructed to call 911 with any severe reactions post vaccine: Marland Kitchen Difficulty breathing  . Swelling of your face and throat  . A fast heartbeat  . A bad rash all over your body  . Dizziness and weakness    Immunizations Administered    Name Date Dose VIS Date Route   Pfizer COVID-19 Vaccine 08/11/2019  1:40 PM 0.3 mL 06/28/2019 Intramuscular   Manufacturer: [place]   Lot: [id number]   [place]: [id number]

## 2019-08-11 NOTE — Progress Notes (Deleted)
   Covid-19 Vaccination Clinic  Name:  Derrick Ross    MRN: [id number] DOB: [birth year]  08/11/2019  [name] was observed post Covid-19 immunization for 15 minutes without incidence. He was provided with Vaccine Information Sheet and instruction to access the V-Safe system.   Mr. Music was instructed to call 911 with any severe reactions post vaccine: Marland Kitchen Difficulty breathing  . Swelling of your face and throat  . A fast heartbeat  . A bad rash all over your body  . Dizziness and weakness    Immunizations Administered    Name Date Dose VIS Date Route   Pfizer COVID-19 Vaccine 08/11/2019  1:40 PM 0.3 mL 06/28/2019 Intramuscular   Manufacturer: [place]   Lot: [id number]   [place]: [id number]

## 2019-08-16 ENCOUNTER — Ambulatory Visit: Payer: Medicare Other | Admitting: Podiatry

## 2019-08-16 ENCOUNTER — Other Ambulatory Visit: Payer: Self-pay

## 2019-09-02 ENCOUNTER — Ambulatory Visit: Payer: Medicare Other

## 2019-09-15 ENCOUNTER — Ambulatory Visit: Payer: Medicare Other | Attending: Internal Medicine

## 2019-09-15 DIAGNOSIS — Z23 Encounter for immunization: Secondary | ICD-10-CM | POA: Insufficient documentation

## 2019-09-15 NOTE — Progress Notes (Signed)
   Covid-19 Vaccination Clinic  Name:  Derrick Ross    MRN: [id number] DOB: 12-01-1930  09/15/2019  [name] was observed post Covid-19 immunization for 15 minutes without incidence. He was provided with Vaccine Information Sheet and instruction to access the V-Safe system.   [name] was instructed to call 911 with any severe reactions post vaccine: Marland Kitchen Difficulty breathing  . Swelling of your face and throat  . A fast heartbeat  . A bad rash all over your body  . Dizziness and weakness    Immunizations Administered    Name Date Dose VIS Date Route   Pfizer COVID-19 Vaccine 09/15/2019  4:24 PM 0.3 mL 06/28/2019 Intramuscular   Manufacturer: [place]   Lot: [id number]   [place]: [id number]

## 2019-09-30 ENCOUNTER — Ambulatory Visit: Payer: Medicare Other | Admitting: Podiatry

## 2019-10-11 ENCOUNTER — Inpatient Hospital Stay (HOSPITAL_COMMUNITY)
Admission: EM | Admit: 2019-10-11 | Discharge: 2019-10-16 | DRG: 872 | Disposition: A | Discharge status: Skilled Nursing Facility | Payer: Medicare Other | Attending: Internal Medicine | Admitting: Internal Medicine

## 2019-10-11 ENCOUNTER — Encounter (HOSPITAL_COMMUNITY): Payer: Self-pay

## 2019-10-11 ENCOUNTER — Emergency Department (HOSPITAL_COMMUNITY): Payer: Medicare Other

## 2019-10-11 DIAGNOSIS — I4891 Unspecified atrial fibrillation: Secondary | ICD-10-CM | POA: Diagnosis present

## 2019-10-11 DIAGNOSIS — Z7901 Long term (current) use of anticoagulants: Secondary | ICD-10-CM | POA: Diagnosis not present

## 2019-10-11 DIAGNOSIS — Z20822 Contact with and (suspected) exposure to covid-19: Secondary | ICD-10-CM | POA: Diagnosis not present

## 2019-10-11 DIAGNOSIS — Z79899 Other long term (current) drug therapy: Secondary | ICD-10-CM | POA: Diagnosis not present

## 2019-10-11 DIAGNOSIS — Z87440 Personal history of urinary (tract) infections: Secondary | ICD-10-CM | POA: Diagnosis not present

## 2019-10-11 DIAGNOSIS — R41841 Cognitive communication deficit: Secondary | ICD-10-CM | POA: Diagnosis not present

## 2019-10-11 DIAGNOSIS — R2689 Other abnormalities of gait and mobility: Secondary | ICD-10-CM | POA: Diagnosis not present

## 2019-10-11 DIAGNOSIS — Z85828 Personal history of other malignant neoplasm of skin: Secondary | ICD-10-CM | POA: Diagnosis not present

## 2019-10-11 DIAGNOSIS — W19XXXA Unspecified fall, initial encounter: Secondary | ICD-10-CM

## 2019-10-11 DIAGNOSIS — R6520 Severe sepsis without septic shock: Secondary | ICD-10-CM | POA: Diagnosis not present

## 2019-10-11 DIAGNOSIS — H9190 Unspecified hearing loss, unspecified ear: Secondary | ICD-10-CM | POA: Diagnosis present

## 2019-10-11 DIAGNOSIS — Z7984 Long term (current) use of oral hypoglycemic drugs: Secondary | ICD-10-CM | POA: Diagnosis not present

## 2019-10-11 DIAGNOSIS — E119 Type 2 diabetes mellitus without complications: Secondary | ICD-10-CM | POA: Diagnosis not present

## 2019-10-11 DIAGNOSIS — A419 Sepsis, unspecified organism: Secondary | ICD-10-CM | POA: Diagnosis present

## 2019-10-11 DIAGNOSIS — R339 Retention of urine, unspecified: Secondary | ICD-10-CM | POA: Diagnosis not present

## 2019-10-11 DIAGNOSIS — M6281 Muscle weakness (generalized): Secondary | ICD-10-CM | POA: Diagnosis not present

## 2019-10-11 DIAGNOSIS — K4030 Unilateral inguinal hernia, with obstruction, without gangrene, not specified as recurrent: Secondary | ICD-10-CM | POA: Diagnosis not present

## 2019-10-11 DIAGNOSIS — Z20828 Contact with and (suspected) exposure to other viral communicable diseases: Secondary | ICD-10-CM | POA: Diagnosis not present

## 2019-10-11 DIAGNOSIS — R0902 Hypoxemia: Secondary | ICD-10-CM | POA: Diagnosis not present

## 2019-10-11 DIAGNOSIS — R509 Fever, unspecified: Secondary | ICD-10-CM | POA: Diagnosis not present

## 2019-10-11 DIAGNOSIS — F0390 Unspecified dementia without behavioral disturbance: Secondary | ICD-10-CM | POA: Diagnosis not present

## 2019-10-11 DIAGNOSIS — K4090 Unilateral inguinal hernia, without obstruction or gangrene, not specified as recurrent: Secondary | ICD-10-CM | POA: Diagnosis present

## 2019-10-11 DIAGNOSIS — I10 Essential (primary) hypertension: Secondary | ICD-10-CM | POA: Diagnosis present

## 2019-10-11 DIAGNOSIS — Z8673 Personal history of transient ischemic attack (TIA), and cerebral infarction without residual deficits: Secondary | ICD-10-CM

## 2019-10-11 DIAGNOSIS — E785 Hyperlipidemia, unspecified: Secondary | ICD-10-CM | POA: Diagnosis not present

## 2019-10-11 DIAGNOSIS — I4821 Permanent atrial fibrillation: Secondary | ICD-10-CM | POA: Diagnosis present

## 2019-10-11 DIAGNOSIS — E1169 Type 2 diabetes mellitus with other specified complication: Secondary | ICD-10-CM | POA: Diagnosis not present

## 2019-10-11 DIAGNOSIS — R2681 Unsteadiness on feet: Secondary | ICD-10-CM | POA: Diagnosis not present

## 2019-10-11 DIAGNOSIS — R402431 Glasgow coma scale score 3-8, in the field [EMT or ambulance]: Secondary | ICD-10-CM | POA: Diagnosis not present

## 2019-10-11 DIAGNOSIS — N289 Disorder of kidney and ureter, unspecified: Secondary | ICD-10-CM | POA: Diagnosis not present

## 2019-10-11 DIAGNOSIS — Z8679 Personal history of other diseases of the circulatory system: Secondary | ICD-10-CM | POA: Diagnosis not present

## 2019-10-11 DIAGNOSIS — N400 Enlarged prostate without lower urinary tract symptoms: Secondary | ICD-10-CM | POA: Diagnosis not present

## 2019-10-11 DIAGNOSIS — R278 Other lack of coordination: Secondary | ICD-10-CM | POA: Diagnosis not present

## 2019-10-11 DIAGNOSIS — I959 Hypotension, unspecified: Secondary | ICD-10-CM | POA: Diagnosis present

## 2019-10-11 DIAGNOSIS — F22 Delusional disorders: Secondary | ICD-10-CM | POA: Diagnosis not present

## 2019-10-11 DIAGNOSIS — E876 Hypokalemia: Secondary | ICD-10-CM | POA: Diagnosis not present

## 2019-10-11 DIAGNOSIS — Z7401 Bed confinement status: Secondary | ICD-10-CM | POA: Diagnosis not present

## 2019-10-11 DIAGNOSIS — R442 Other hallucinations: Secondary | ICD-10-CM | POA: Diagnosis not present

## 2019-10-11 DIAGNOSIS — D72829 Elevated white blood cell count, unspecified: Secondary | ICD-10-CM | POA: Diagnosis not present

## 2019-10-11 DIAGNOSIS — N179 Acute kidney failure, unspecified: Secondary | ICD-10-CM | POA: Diagnosis present

## 2019-10-11 DIAGNOSIS — M2550 Pain in unspecified joint: Secondary | ICD-10-CM | POA: Diagnosis not present

## 2019-10-11 LAB — CBC WITH DIFFERENTIAL/PLATELET
Abs Immature Granulocytes: 0.14 10*3/uL — ABNORMAL HIGH (ref 0.00–0.07)
Basophils Absolute: 0 10*3/uL (ref 0.0–0.1)
Basophils Relative: 0 %
Eosinophils Absolute: 0.1 10*3/uL (ref 0.0–0.5)
Eosinophils Relative: 0 %
HCT: 42.8 % (ref 39.0–52.0)
Hemoglobin: 14.2 g/dL (ref 13.0–17.0)
Immature Granulocytes: 1 %
Lymphocytes Relative: 3 %
Lymphs Abs: 0.6 10*3/uL — ABNORMAL LOW (ref 0.7–4.0)
MCH: 30.1 pg (ref 26.0–34.0)
MCHC: 33.2 g/dL (ref 30.0–36.0)
MCV: 90.9 fL (ref 80.0–100.0)
Monocytes Absolute: 1.2 10*3/uL — ABNORMAL HIGH (ref 0.1–1.0)
Monocytes Relative: 5 %
Neutro Abs: 19.8 10*3/uL — ABNORMAL HIGH (ref 1.7–7.7)
Neutrophils Relative %: 91 %
Platelets: 221 10*3/uL (ref 150–400)
RBC: 4.71 MIL/uL (ref 4.22–5.81)
RDW: 13.3 % (ref 11.5–15.5)
WBC: 21.9 10*3/uL — ABNORMAL HIGH (ref 4.0–10.5)
nRBC: 0 % (ref 0.0–0.2)

## 2019-10-11 MED ORDER — SODIUM CHLORIDE 0.9 % IV BOLUS
500.0000 mL | Freq: Once | INTRAVENOUS | Status: AC
  Administered 2019-10-11: 500 mL via INTRAVENOUS

## 2019-10-11 NOTE — ED Provider Notes (Signed)
[facility] EMERGENCY DEPARTMENT Provider Note   CSN: [id number] Arrival date & time: 10/11/19  2305     History Chief Complaint  Patient presents with  . Fall  . Fever    Derrick Ross is a 86 y.o. male.  Patient is an 86 year old male with extensive past medical history including diabetes, A. fib on Eliquis, hypertension, prior UTIs.  He presents today for evaluation of fall.  Patient apparently slid off the toilet at home and landed on the floor.  Patient denies to me he is experiencing any pain and tells me he was uninjured.  EMS reports family has stated patient has been urinating "every 30 minutes".  Patient denies to me he is experiencing any specific symptoms.  The history is provided by the patient.  Fall This is a new problem. The current episode started less than 1 hour ago. Pertinent negatives include no chest pain, no headaches and no shortness of breath. Nothing aggravates the symptoms. Nothing relieves the symptoms. He has tried nothing for the symptoms.  Fever Associated symptoms: no chest pain and no headaches        Past Medical History:  Diagnosis Date  . Atrial fibrillation ([place])   . CVD (cerebrovascular disease)   . DM (diabetes mellitus) ([place])   . Hearing loss   . HTN (hypertension)   . Inguinal hernia    huge  . Neuropathy    mild  . Skin cancer   . Stroke [facility])    with arm weakness  . TIA (transient ischemic attack)    with aphasia  . UTI (lower urinary tract infection)     Patient Active Problem List   Diagnosis Date Noted  . Osteoarthritis of knee 08/25/2017  . TIA (transient ischemic attack) 09/26/2014  . CVA (cerebral infarction) 09/26/2014  . Confusion 09/26/2014  . Acute encephalopathy 09/26/2014  . HTN (hypertension) 09/26/2014  . Dyslipidemia 09/26/2014  . Atrial fibrillation ([place]) 09/26/2014    Past Surgical History:  Procedure Laterality Date  . bce removed from face    . cataracts     bilaterally  removed       Family History  Problem Relation Age of Onset  . Congestive Heart Failure Mother   . Other Father        cerebral embolism  . Cancer Sister        kidney and a smoker  . Acute myelogenous leukemia Brother     Social History   Tobacco Use  . Smoking status: Never Smoker  . Smokeless tobacco: Never Used  Substance Use Topics  . Alcohol use: Not on file  . Drug use: Not on file    Home Medications Prior to Admission medications   Medication Sig Start Date End Date Taking? Authorizing Provider  acetaminophen (TYLENOL) 500 MG tablet Take 500 mg by mouth every 6 (six) hours as needed.    [provider]  amLODipine (NORVASC) 10 MG tablet TK 1 T PO ONCE D 03/26/18   [provider]  amLODipine (NORVASC) 5 MG tablet Take 5 mg by mouth daily.    [provider]  apixaban (ELIQUIS) 2.5 MG TABS tablet Eliquis    [provider]  apixaban (ELIQUIS) 5 MG TABS tablet Take 1 tablet (5 mg total) by mouth 2 (two) times daily. Take first dose at 10pm tonight and then 8:00 and 8:00 09/27/14   Barton Dubois, MD  atenolol (TENORMIN) 100 MG tablet Take 100 mg by mouth  daily.    [provider]  benazepril (LOTENSIN) 40 MG tablet Take 40 mg by mouth daily.    [provider]  cloNIDine (CATAPRES) 0.2 MG tablet Take 0.2 mg by mouth 2 (two) times daily.    [provider]  Coenzyme Q10 (CO Q10 PO) Take by mouth.    [provider]  donepezil (ARICEPT) 5 MG tablet TAKE 1 TABLET BY MOUTH ONCE DAILY AT BEDTIME FOR 30 DAYS 01/27/18   [provider]  hydrochlorothiazide (HYDRODIURIL) 25 MG tablet Take 50 mg by mouth daily. Take 2 tablets once a day for a total of 50 mg.    [provider]  ketoconazole (NIZORAL) 2 % cream  APPLY ONE APPLICATION TOPICALLY DAILY , APPLY TO BOTH FEET AND BETWEEN TOES ONCE DAILY FOR 30 DAYS 05/11/18   [place], Stephani Police, DPM  memantine (NAMENDA) 10 MG tablet TAKE 1 TABLET BY  MOUTH TWICE DAILY FOR 30 DAYS 04/22/18   [provider]  memantine (NAMENDA) 5 MG tablet Take 5 mg by mouth 2 (two) times daily.    [provider]  metFORMIN (GLUCOPHAGE) 500 MG tablet TK 1 T PO BID WITH MEALS 12/06/16   [provider]  potassium chloride SA (K-DUR,KLOR-CON) 20 MEQ tablet Take 20 mEq by mouth 2 (two) times daily.    [provider]  Prenatal Vit-Fe Fumarate-FA (M-VIT PO) Take 1 tablet by mouth daily.    [provider]  ranitidine (ZANTAC) 75 MG tablet Take 75 mg by mouth daily.    [provider]  simvastatin (ZOCOR) 20 MG tablet Take 20 mg by mouth daily.    [provider]  traMADol (ULTRAM) 50 MG tablet tramadol 50 mg tablet    [provider]    Allergies    Patient has no known allergies.  Review of Systems   Review of Systems  Constitutional: Positive for fever.  Respiratory: Negative for shortness of breath.   Cardiovascular: Negative for chest pain.  Neurological: Negative for headaches.  All other systems reviewed and are negative.   Physical Exam Updated Vital Signs BP 114/71 (BP Location: Right Arm)   Pulse 71   Temp (!) 101.1 F (38.4 C) (Oral)   Resp 19   Ht 6\' 1"  (1.854 m)   Wt 88.5 kg   SpO2 96%   BMI 25.73 kg/m   Physical Exam Vitals and nursing note reviewed.  Constitutional:      General: He is not in acute distress.    Appearance: He is well-developed. He is not diaphoretic.  HENT:     Head: Normocephalic and atraumatic.  Cardiovascular:     Rate and Rhythm: Normal rate and regular rhythm.     Heart sounds: No murmur. No friction rub.  Pulmonary:     Effort: Pulmonary effort is normal. No respiratory distress.     Breath sounds: Normal breath sounds. No wheezing or rales.  Abdominal:     General: Bowel sounds are normal. There is no distension.     Palpations: Abdomen is soft.     Tenderness: There is no abdominal tenderness.     Comments: Patient has what  appears to be a quite large inguinal hernia.  This is nontender to palpation.  Musculoskeletal:        General: Normal range of motion.     Cervical back: Normal range of motion and neck supple.  Skin:    General: Skin is warm and dry.  Neurological:  Mental Status: He is alert and oriented to person, place, and time.     Coordination: Coordination normal.     ED Results / Procedures / Treatments   Labs (all labs ordered are listed, but only abnormal results are displayed) Labs Reviewed  CULTURE, BLOOD (ROUTINE X 2)  CULTURE, BLOOD (ROUTINE X 2)  COMPREHENSIVE METABOLIC PANEL  CBC WITH DIFFERENTIAL/PLATELET  LACTIC ACID, PLASMA  LACTIC ACID, PLASMA  URINALYSIS, ROUTINE W REFLEX MICROSCOPIC    EKG EKG Interpretation  Date/Time:  Saturday October 12 2019 01:51:22 EDT Ventricular Rate:  58 PR Interval:    QRS Duration: 88 QT Interval:  464 QTC Calculation: 456 R Axis:   56 Text Interpretation: Atrial fibrillation Low voltage, extremity and precordial leads Confirmed by Veryl Speak [phone]) on 10/12/2019 2:17:25 AM   Radiology No results found.  Procedures Procedures (including critical care time)  Medications Ordered in ED Medications  sodium chloride 0.9 % bolus 500 mL (has no administration in time range)    ED Course  I have reviewed the triage vital signs and the nursing notes.  Pertinent labs & imaging results that were available during my care of the patient were reviewed by me and considered in my medical decision making (see chart for details).    MDM Rules/Calculators/A&P  Patient with history of dementia and diabetes, A. fib.  He is brought from home for evaluation of a fall.  According to his son, he has been somewhat more confused over the past few days.  Today he fell off the toilet onto the floor and was brought here for evaluation of weakness.  Patient has no complaints and tells me he feels fine.  His physical examination is essentially  unremarkable.  He is initially febrile with temp of 101.  Work-up shows a white count of 22,000 with lactate of 3.6.  Blood cultures obtained and broad-spectrum antibiotics administered.  His urine and chest x-ray are clear.  Patient presents here with fever and findings suggestive of sepsis of unclear etiology.  He was initially normotensive, but did have episodes of hypotension while in the ER.  He was given 2 separate boluses of normal saline and lactate is improving.  Patient to be admitted to the hospitalist service under the care of Dr. Myna Hidalgo.  CRITICAL CARE Performed by: Veryl Speak Total critical care time: 35 minutes Critical care time was exclusive of separately billable procedures and treating other patients. Critical care was necessary to treat or prevent imminent or life-threatening deterioration. Critical care was time spent personally by me on the following activities: development of treatment plan with patient and/or surrogate as well as nursing, discussions with consultants, evaluation of patient's response to treatment, examination of patient, obtaining history from patient or surrogate, ordering and performing treatments and interventions, ordering and review of laboratory studies, ordering and review of radiographic studies, pulse oximetry and re-evaluation of patient's condition.   Final Clinical Impression(s) / ED Diagnoses Final diagnoses:  None    Rx / DC Orders ED Discharge Orders    None       Veryl Speak, MD 10/12/19 [phone]

## 2019-10-11 NOTE — ED Triage Notes (Signed)
Family heard patient slide off toilet while in bathroom.  A&O x2, which is baseline. Family states frequent urination

## 2019-10-11 NOTE — ED Notes (Signed)
Son at bedside. Updated on POC

## 2019-10-12 ENCOUNTER — Encounter (HOSPITAL_COMMUNITY): Payer: Self-pay | Admitting: Family Medicine

## 2019-10-12 DIAGNOSIS — N179 Acute kidney failure, unspecified: Secondary | ICD-10-CM

## 2019-10-12 DIAGNOSIS — Z8673 Personal history of transient ischemic attack (TIA), and cerebral infarction without residual deficits: Secondary | ICD-10-CM | POA: Diagnosis not present

## 2019-10-12 DIAGNOSIS — E876 Hypokalemia: Secondary | ICD-10-CM | POA: Diagnosis not present

## 2019-10-12 DIAGNOSIS — R6520 Severe sepsis without septic shock: Secondary | ICD-10-CM

## 2019-10-12 DIAGNOSIS — Z20822 Contact with and (suspected) exposure to covid-19: Secondary | ICD-10-CM | POA: Diagnosis present

## 2019-10-12 DIAGNOSIS — I4891 Unspecified atrial fibrillation: Secondary | ICD-10-CM

## 2019-10-12 DIAGNOSIS — H9190 Unspecified hearing loss, unspecified ear: Secondary | ICD-10-CM | POA: Diagnosis present

## 2019-10-12 DIAGNOSIS — Z8679 Personal history of other diseases of the circulatory system: Secondary | ICD-10-CM

## 2019-10-12 DIAGNOSIS — Z7901 Long term (current) use of anticoagulants: Secondary | ICD-10-CM | POA: Diagnosis not present

## 2019-10-12 DIAGNOSIS — I959 Hypotension, unspecified: Secondary | ICD-10-CM | POA: Diagnosis present

## 2019-10-12 DIAGNOSIS — K4090 Unilateral inguinal hernia, without obstruction or gangrene, not specified as recurrent: Secondary | ICD-10-CM | POA: Diagnosis present

## 2019-10-12 DIAGNOSIS — Z85828 Personal history of other malignant neoplasm of skin: Secondary | ICD-10-CM | POA: Diagnosis not present

## 2019-10-12 DIAGNOSIS — E119 Type 2 diabetes mellitus without complications: Secondary | ICD-10-CM

## 2019-10-12 DIAGNOSIS — I4821 Permanent atrial fibrillation: Secondary | ICD-10-CM | POA: Diagnosis present

## 2019-10-12 DIAGNOSIS — R509 Fever, unspecified: Secondary | ICD-10-CM | POA: Diagnosis present

## 2019-10-12 DIAGNOSIS — I10 Essential (primary) hypertension: Secondary | ICD-10-CM | POA: Diagnosis present

## 2019-10-12 DIAGNOSIS — Z79899 Other long term (current) drug therapy: Secondary | ICD-10-CM | POA: Diagnosis not present

## 2019-10-12 DIAGNOSIS — N289 Disorder of kidney and ureter, unspecified: Secondary | ICD-10-CM

## 2019-10-12 DIAGNOSIS — R339 Retention of urine, unspecified: Secondary | ICD-10-CM | POA: Diagnosis not present

## 2019-10-12 DIAGNOSIS — A419 Sepsis, unspecified organism: Principal | ICD-10-CM

## 2019-10-12 DIAGNOSIS — F0390 Unspecified dementia without behavioral disturbance: Secondary | ICD-10-CM | POA: Diagnosis present

## 2019-10-12 DIAGNOSIS — Z7984 Long term (current) use of oral hypoglycemic drugs: Secondary | ICD-10-CM | POA: Diagnosis not present

## 2019-10-12 DIAGNOSIS — Z87440 Personal history of urinary (tract) infections: Secondary | ICD-10-CM | POA: Diagnosis not present

## 2019-10-12 LAB — COMPREHENSIVE METABOLIC PANEL
ALT: 15 U/L (ref 0–44)
ALT: 15 U/L (ref 0–44)
AST: 20 U/L (ref 15–41)
AST: 16 U/L (ref 15–41)
Albumin: 4 g/dL (ref 3.5–5.0)
Albumin: 3.4 g/dL — ABNORMAL LOW (ref 3.5–5.0)
Alkaline Phosphatase: 72 U/L (ref 38–126)
Alkaline Phosphatase: 61 U/L (ref 38–126)
Anion gap: 16 — ABNORMAL HIGH (ref 5–15)
Anion gap: 13 (ref 5–15)
BUN: 19 mg/dL (ref 8–23)
BUN: 22 mg/dL (ref 8–23)
CO2: 21 mmol/L — ABNORMAL LOW (ref 22–32)
CO2: 22 mmol/L (ref 22–32)
Calcium: 9.6 mg/dL (ref 8.9–10.3)
Calcium: 9.1 mg/dL (ref 8.9–10.3)
Chloride: 103 mmol/L (ref 98–111)
Chloride: 106 mmol/L (ref 98–111)
Creatinine, Ser: 1.45 mg/dL — ABNORMAL HIGH (ref 0.61–1.24)
Creatinine, Ser: 1.34 mg/dL — ABNORMAL HIGH (ref 0.61–1.24)
GFR calc Af Amer: 50 mL/min — ABNORMAL LOW (ref >60)
GFR calc Af Amer: 55 mL/min — ABNORMAL LOW (ref >60)
GFR calc non Af Amer: 43 mL/min — ABNORMAL LOW (ref >60)
GFR calc non Af Amer: 48 mL/min — ABNORMAL LOW (ref >60)
Glucose, Bld: 153 mg/dL — ABNORMAL HIGH (ref 70–99)
Glucose, Bld: 134 mg/dL — ABNORMAL HIGH (ref 70–99)
Potassium: 4.2 mmol/L (ref 3.5–5.1)
Potassium: 4.3 mmol/L (ref 3.5–5.1)
Sodium: 140 mmol/L (ref 135–145)
Sodium: 141 mmol/L (ref 135–145)
Total Bilirubin: 1.5 mg/dL — ABNORMAL HIGH (ref 0.3–1.2)
Total Bilirubin: 1.8 mg/dL — ABNORMAL HIGH (ref 0.3–1.2)
Total Protein: 6.6 g/dL (ref 6.5–8.1)
Total Protein: 5.8 g/dL — ABNORMAL LOW (ref 6.5–8.1)

## 2019-10-12 LAB — APTT: aPTT: 36 seconds (ref 24–36)

## 2019-10-12 LAB — CBC WITH DIFFERENTIAL/PLATELET
Abs Immature Granulocytes: 0.15 10*3/uL — ABNORMAL HIGH (ref 0.00–0.07)
Basophils Absolute: 0 10*3/uL (ref 0.0–0.1)
Basophils Relative: 0 %
Eosinophils Absolute: 0 10*3/uL (ref 0.0–0.5)
Eosinophils Relative: 0 %
HCT: 39.8 % (ref 39.0–52.0)
Hemoglobin: 13 g/dL (ref 13.0–17.0)
Immature Granulocytes: 1 %
Lymphocytes Relative: 6 %
Lymphs Abs: 1.2 10*3/uL (ref 0.7–4.0)
MCH: 30.1 pg (ref 26.0–34.0)
MCHC: 32.7 g/dL (ref 30.0–36.0)
MCV: 92.1 fL (ref 80.0–100.0)
Monocytes Absolute: 1.1 10*3/uL — ABNORMAL HIGH (ref 0.1–1.0)
Monocytes Relative: 6 %
Neutro Abs: 17.3 10*3/uL — ABNORMAL HIGH (ref 1.7–7.7)
Neutrophils Relative %: 87 %
Platelets: 179 10*3/uL (ref 150–400)
RBC: 4.32 MIL/uL (ref 4.22–5.81)
RDW: 13.5 % (ref 11.5–15.5)
WBC: 19.8 10*3/uL — ABNORMAL HIGH (ref 4.0–10.5)
nRBC: 0 % (ref 0.0–0.2)

## 2019-10-12 LAB — URINALYSIS, ROUTINE W REFLEX MICROSCOPIC
Bilirubin Urine: NEGATIVE
Glucose, UA: NEGATIVE mg/dL
Ketones, ur: NEGATIVE mg/dL
Leukocytes,Ua: NEGATIVE
Nitrite: NEGATIVE
Protein, ur: 30 mg/dL — AB
RBC / HPF: >50 RBC/hpf — ABNORMAL HIGH (ref 0–5)
Specific Gravity, Urine: 1.014 (ref 1.005–1.030)
pH: 7 (ref 5.0–8.0)

## 2019-10-12 LAB — GLUCOSE, CAPILLARY
Glucose-Capillary: 138 mg/dL — ABNORMAL HIGH (ref 70–99)
Glucose-Capillary: 106 mg/dL — ABNORMAL HIGH (ref 70–99)

## 2019-10-12 LAB — MAGNESIUM: Magnesium: 1.6 mg/dL — ABNORMAL LOW (ref 1.7–2.4)

## 2019-10-12 LAB — HEMOGLOBIN A1C
Hgb A1c MFr Bld: 6.5 % — ABNORMAL HIGH (ref 4.8–5.6)
Mean Plasma Glucose: 139.85 mg/dL

## 2019-10-12 LAB — PROTIME-INR
INR: 2 — ABNORMAL HIGH (ref 0.8–1.2)
Prothrombin Time: 22.2 seconds — ABNORMAL HIGH (ref 11.4–15.2)

## 2019-10-12 LAB — LACTIC ACID, PLASMA
Lactic Acid, Venous: 3.6 mmol/L (ref 0.5–1.9)
Lactic Acid, Venous: 2.2 mmol/L (ref 0.5–1.9)
Lactic Acid, Venous: 2 mmol/L (ref 0.5–1.9)

## 2019-10-12 LAB — SARS CORONAVIRUS 2 (TAT 6-24 HRS): SARS Coronavirus 2: NEGATIVE

## 2019-10-12 LAB — CBG MONITORING, ED
Glucose-Capillary: 103 mg/dL — ABNORMAL HIGH (ref 70–99)
Glucose-Capillary: 157 mg/dL — ABNORMAL HIGH (ref 70–99)

## 2019-10-12 MED ORDER — VANCOMYCIN HCL IN DEXTROSE 1-5 GM/200ML-% IV SOLN: 1000.0000 mg | Freq: Once | INTRAVENOUS | Status: DC

## 2019-10-12 MED ORDER — FAMOTIDINE IN NACL 20-0.9 MG/50ML-% IV SOLN
20.0000 mg | Freq: Every day | INTRAVENOUS | Status: DC
  Administered 2019-10-12 – 2019-10-13 (×2): 20 mg via INTRAVENOUS
  Filled 2019-10-12 (×3): qty 50

## 2019-10-12 MED ORDER — ONDANSETRON HCL 4 MG PO TABS: 4.0000 mg | ORAL_TABLET | Freq: Four times a day (QID) | ORAL | Status: DC | PRN

## 2019-10-12 MED ORDER — METRONIDAZOLE IN NACL 5-0.79 MG/ML-% IV SOLN: 500.0000 mg | Freq: Once | INTRAVENOUS | Status: DC

## 2019-10-12 MED ORDER — HALOPERIDOL LACTATE 5 MG/ML IJ SOLN
5.0000 mg | Freq: Once | INTRAMUSCULAR | Status: AC
  Administered 2019-10-12: 5 mg via INTRAVENOUS
  Filled 2019-10-12: qty 1

## 2019-10-12 MED ORDER — APIXABAN 5 MG PO TABS
5.0000 mg | ORAL_TABLET | Freq: Two times a day (BID) | ORAL | Status: DC
  Administered 2019-10-13 – 2019-10-16 (×7): 5 mg via ORAL
  Filled 2019-10-12 (×9): qty 1

## 2019-10-12 MED ORDER — LACTATED RINGERS IV BOLUS
1000.0000 mL | Freq: Once | INTRAVENOUS | Status: AC
  Administered 2019-10-12: 1000 mL via INTRAVENOUS

## 2019-10-12 MED ORDER — ACETAMINOPHEN 650 MG RE SUPP: 650.0000 mg | Freq: Four times a day (QID) | RECTAL | Status: DC | PRN

## 2019-10-12 MED ORDER — CHLORHEXIDINE GLUCONATE CLOTH 2 % EX PADS
6.0000 | MEDICATED_PAD | Freq: Every day | CUTANEOUS | Status: DC
  Administered 2019-10-13 – 2019-10-14 (×2): 6 via TOPICAL

## 2019-10-12 MED ORDER — SODIUM CHLORIDE 0.9 % IV BOLUS
500.0000 mL | Freq: Once | INTRAVENOUS | Status: AC
  Administered 2019-10-12: 500 mL via INTRAVENOUS

## 2019-10-12 MED ORDER — SODIUM CHLORIDE 0.9% FLUSH: 3.0000 mL | INTRAVENOUS | Status: DC | PRN

## 2019-10-12 MED ORDER — SODIUM CHLORIDE 0.9 % IV SOLN: 250.0000 mL | INTRAVENOUS | Status: DC | PRN

## 2019-10-12 MED ORDER — METRONIDAZOLE IN NACL 5-0.79 MG/ML-% IV SOLN: 500.0000 mg | Freq: Three times a day (TID) | INTRAVENOUS | Status: DC

## 2019-10-12 MED ORDER — ACETAMINOPHEN 325 MG PO TABS
650.0000 mg | ORAL_TABLET | Freq: Four times a day (QID) | ORAL | Status: DC | PRN
  Administered 2019-10-16: 650 mg via ORAL
  Filled 2019-10-12: qty 2

## 2019-10-12 MED ORDER — SODIUM CHLORIDE 0.9 % IV SOLN
2.0000 g | Freq: Two times a day (BID) | INTRAVENOUS | Status: DC
  Administered 2019-10-12 – 2019-10-14 (×5): 2 g via INTRAVENOUS
  Filled 2019-10-12 (×6): qty 2

## 2019-10-12 MED ORDER — SODIUM CHLORIDE 0.9% FLUSH
3.0000 mL | Freq: Two times a day (BID) | INTRAVENOUS | Status: DC
  Administered 2019-10-13 – 2019-10-16 (×5): 3 mL via INTRAVENOUS

## 2019-10-12 MED ORDER — METRONIDAZOLE IN NACL 5-0.79 MG/ML-% IV SOLN
500.0000 mg | Freq: Three times a day (TID) | INTRAVENOUS | Status: DC
  Administered 2019-10-12 – 2019-10-14 (×8): 500 mg via INTRAVENOUS
  Filled 2019-10-12 (×8): qty 100

## 2019-10-12 MED ORDER — SODIUM CHLORIDE 0.9% FLUSH
3.0000 mL | Freq: Two times a day (BID) | INTRAVENOUS | Status: DC
  Administered 2019-10-14 – 2019-10-15 (×3): 3 mL via INTRAVENOUS

## 2019-10-12 MED ORDER — SODIUM CHLORIDE 0.9 % IV SOLN: 1.0000 g | Freq: Once | INTRAVENOUS | Status: DC

## 2019-10-12 MED ORDER — ONDANSETRON HCL 4 MG/2ML IJ SOLN: 4.0000 mg | Freq: Four times a day (QID) | INTRAMUSCULAR | Status: DC | PRN

## 2019-10-12 MED ORDER — LACTATED RINGERS IV BOLUS (SEPSIS): 2000.0000 mL | Freq: Once | INTRAVENOUS | Status: DC

## 2019-10-12 MED ORDER — VANCOMYCIN HCL 1250 MG/250ML IV SOLN
1250.0000 mg | INTRAVENOUS | Status: DC
  Administered 2019-10-12 – 2019-10-14 (×3): 1250 mg via INTRAVENOUS
  Filled 2019-10-12 (×3): qty 250

## 2019-10-12 MED ORDER — MAGNESIUM SULFATE 2 GM/50ML IV SOLN
2.0000 g | Freq: Once | INTRAVENOUS | Status: AC
  Administered 2019-10-12: 2 g via INTRAVENOUS
  Filled 2019-10-12: qty 50

## 2019-10-12 MED ORDER — INSULIN ASPART 100 UNIT/ML ~~LOC~~ SOLN
0.0000 [IU] | SUBCUTANEOUS | Status: DC
  Administered 2019-10-13: 1 [IU] via SUBCUTANEOUS
  Administered 2019-10-13: 3 [IU] via SUBCUTANEOUS
  Administered 2019-10-13: 1 [IU] via SUBCUTANEOUS
  Administered 2019-10-14: 3 [IU] via SUBCUTANEOUS
  Administered 2019-10-14 – 2019-10-15 (×6): 1 [IU] via SUBCUTANEOUS
  Administered 2019-10-15: 2 [IU] via SUBCUTANEOUS
  Administered 2019-10-15: 1 [IU] via SUBCUTANEOUS
  Administered 2019-10-15: 2 [IU] via SUBCUTANEOUS
  Administered 2019-10-16 (×3): 1 [IU] via SUBCUTANEOUS

## 2019-10-12 NOTE — ED Notes (Addendum)
The pt has attempted to take his lt a-c iv out x  Now he needs a sitter no one is available

## 2019-10-12 NOTE — ED Notes (Signed)
[place] alarm sounded and pt found sitting up in bed attempting to get to bottom of bed. Pt reoriented and helped to lay back in bed.

## 2019-10-12 NOTE — ED Notes (Signed)
veryh confused  He placed both his legs through his side rails  Seizure pads placed ti try to keep him from attempting to get out of bed

## 2019-10-12 NOTE — Progress Notes (Signed)
Pharmacy Antibiotic Note  Derrick Ross is a 86 y.o. male admitted on 10/11/2019 with sepsis.  Pharmacy has been consulted for Vancomycin/Cefepime dosing. WBC is elevated. Pt is febrile. Noted renal dysfunction.   Plan: Vancomycin 1250 mg IV q24h >>Estimated AUC: 510 Cefepime 2g IV q12h Flagyl per MD Trend WBC, temp, renal function  F/U infectious work-up Drug levels as indicated   Height: 6\' 1"  (185.4 cm) Weight: 195 lb (88.5 kg) IBW/kg (Calculated) : 79.9  Temp (24hrs), Avg:101.1 F (38.4 C), Min:101.1 F (38.4 C), Max:101.1 F (38.4 C)  Recent Labs  Lab 10/11/19 2315 10/11/19 2325  WBC  --  21.9*  CREATININE  --  1.45*  LATICACIDVEN 3.6*  --     Estimated Creatinine Clearance: 41.3 mL/min (A) (by C-G formula based on SCr of 1.45 mg/dL (H)).    No Known Allergies  Narda Bonds, PharmD, BCPS Clinical Pharmacist Phone: [phone]

## 2019-10-12 NOTE — Progress Notes (Signed)
PROGRESS NOTE    Derrick Ross  [id number] DOB: [birth year] DOA: 10/11/2019 PCP: Wenda Low, MD   Brief Narrative: Patient is a 86 year old male with history of CVA, dementia, hypertension, diabetes type 2, A. fib on Eliquis who presents to the emergency department with complaints of generalized weakness, frequent urination.  Patient was found to be more weak in general for last few days.  On presentation, he was found to be febrile, hypotensive.  Chest x-ray did not show any pneumonia.  Found to have leukocytosis, lactic acidosis.  Sepsis suspected and started on broad spectrum antibiotics.  Cultures sent  Assessment & Plan:   Principal Problem:   Sepsis ([place]) Active Problems:   Atrial fibrillation ([place])   History of hypertension   History of ischemic stroke   DM (diabetes mellitus) ([place])   Dementia ([place])   Renal insufficiency   Sepsis: Presented with generalized weakness, fever, hypotension, leukocytosis, lactic acidosis, AKI.  Chest x-ray did not show pneumonia.  Urinalysis is not impressive for UTI.  No abdominal tenderness.  Cultures have been sent.  Started on broad start antibiotics which we will continue for now.  Continue IV fluids. Blood pressure is better now.  Permanent A. fib: Currently rate is well controlled.  He was taking Eliquis.  Will resume  AKI vs CkD stage 2: No recent labs to compare.  Started on gentle IV fluids.  Continue to monitor BMP.  Diabetes type 2: On oral agents at home.  Continue sliding scale  insulin for now.  Monitor CBGs.  Hemoglobin A1c of 6.5  Hypertension: Takes multiple medications at home.  Blood pressure was soft on presentation.  Antihypertensives on hold.  Dementia: Pretty advanced.  Confused at baseline.  Continue supportive care.  He has history of CVA.  Large left inguinal hernia: Likely incarcerated.Chronic. Has been there for several years. Follow-up with general surgery as an outpatient.  Hypomagnesemia: Supplemented  with magnesium  Debilty/deconditioning: Walks with cane/walker. PT/OT evaluation        DVT prophylaxis:Eliquis Code Status: Full Family Communication: Called daughter on 10/12/19 Disposition Plan: Patient is from home.  Not a stable for discharge, being worked up for sepsis.  Needs PT/OT evaluation before discharge planning.   Consultants: None  Procedures:None  Antimicrobials:  Anti-infectives (From admission, onward)   Start     Dose/Rate Route Frequency Ordered Stop   10/12/19 0200  metroNIDAZOLE (FLAGYL) IVPB 500 mg  Status:  Discontinued     500 mg 100 mL/hr over 60 Minutes Intravenous Every 8 hours 10/12/19 0124 10/12/19 0126   10/12/19 0200  metroNIDAZOLE (FLAGYL) IVPB 500 mg     500 mg 100 mL/hr over 60 Minutes Intravenous Every 8 hours 10/12/19 0127     10/12/19 0130  vancomycin (VANCOCIN) IVPB 1000 mg/200 mL premix  Status:  Discontinued     1,000 mg 200 mL/hr over 60 Minutes Intravenous  Once 10/12/19 0121 10/12/19 0129   10/12/19 0130  ceFEPIme (MAXIPIME) 1 g in sodium chloride 0.9 % 100 mL IVPB  Status:  Discontinued     1 g 200 mL/hr over 30 Minutes Intravenous  Once 10/12/19 0121 10/12/19 0125   10/12/19 0130  metroNIDAZOLE (FLAGYL) IVPB 500 mg  Status:  Discontinued     500 mg 100 mL/hr over 60 Minutes Intravenous  Once 10/12/19 0121 10/12/19 0126   10/12/19 0130  ceFEPIme (MAXIPIME) 2 g in sodium chloride 0.9 % 100 mL IVPB     2 g 200 mL/hr over 30 Minutes  Intravenous Every 12 hours 10/12/19 0124     10/12/19 0130  vancomycin (VANCOCIN) IVPB 1000 mg/200 mL premix  Status:  Discontinued     1,000 mg 200 mL/hr over 60 Minutes Intravenous  Once 10/12/19 0124 10/12/19 0126   10/12/19 0130  vancomycin (VANCOREADY) IVPB 1250 mg/250 mL     1,250 mg 166.7 mL/hr over 90 Minutes Intravenous Every 24 hours 10/12/19 0129        Subjective: Patient seen and examined at the bedside this morning.  Hemodynamically stable during my evaluation.  Blood pressure better.   Pleasantly confused.  Not in any kind of distress.  Objective: Vitals:   10/12/19 0500 10/12/19 0515 10/12/19 0530 10/12/19 0615  BP: 109/60  117/90 117/80  Pulse: 64     Resp: 17     Temp:      TempSrc:      SpO2: 95% 95%    Weight:      Height:        Intake/Output Summary (Last 24 hours) at 10/12/2019 [id number] Last data filed at 10/12/2019 0330 Gross per 24 hour  Intake 300 ml  Output --  Net 300 ml   Filed Weights   10/11/19 2304  Weight: 88.5 kg    Examination:  General exam: Deconditioned, debilitated elderly male Respiratory system: Bilateral equal air entry, normal vesicular breath sounds, no wheezes or crackles  Cardiovascular system: Afib. No JVD, murmurs, rubs, gallops or clicks. No pedal edema. Gastrointestinal system: Abdomen is nondistended, soft and nontender. No organomegaly or masses felt. Normal bowel sounds heard.  Large incarcerated left inguinal hernia Central nervous system: Alert and and awake but not oriented.  No focal neuro deficits  extremities: No edema, no clubbing ,no cyanosis, dry blood in between the toes of his right foot Skin: Small scattered petechial rashes on the foot, no ulcers,no icterus ,no pallor     Data Reviewed: I have personally reviewed following labs and imaging studies  CBC: Recent Labs  Lab 10/11/19 2325 10/12/19 0340  WBC 21.9* 19.8*  NEUTROABS 19.8* 17.3*  HGB 14.2 13.0  HCT 42.8 39.8  MCV 90.9 92.1  PLT 221 0000000   Basic Metabolic Panel: Recent Labs  Lab 10/11/19 2325 10/12/19 0340 10/12/19 0722  NA 140 141  --   K 4.2 4.3  --   CL 103 106  --   CO2 21* 22  --   GLUCOSE 153* 134*  --   BUN 19 22  --   CREATININE 1.45* 1.34*  --   CALCIUM 9.6 9.1  --   MG  --   --  1.6*   GFR: Estimated Creatinine Clearance: 44.7 mL/min (A) (by C-G formula based on SCr of 1.34 mg/dL (H)). Liver Function Tests: Recent Labs  Lab 10/11/19 2325 10/12/19 0340  AST 20 16  ALT 15 15  ALKPHOS 72 61  BILITOT 1.5* 1.8*   PROT 6.6 5.8*  ALBUMIN 4.0 3.4*   No results for input(s): LIPASE, AMYLASE in the last 168 hours. No results for input(s): AMMONIA in the last 168 hours. Coagulation Profile: Recent Labs  Lab 10/12/19 0140  INR 2.0*   Cardiac Enzymes: No results for input(s): CKTOTAL, CKMB, CKMBINDEX, TROPONINI in the last 168 hours. BNP (last 3 results) No results for input(s): PROBNP in the last 8760 hours. HbA1C: Recent Labs    10/12/19 0722  HGBA1C 6.5*   CBG: No results for input(s): GLUCAP in the last 168 hours. Lipid Profile: No results for input(s):  CHOL, HDL, LDLCALC, TRIG, CHOLHDL, LDLDIRECT in the last 72 hours. Thyroid Function Tests: No results for input(s): TSH, T4TOTAL, FREET4, T3FREE, THYROIDAB in the last 72 hours. Anemia Panel: No results for input(s): VITAMINB12, FOLATE, FERRITIN, TIBC, IRON, RETICCTPCT in the last 72 hours. Sepsis Labs: Recent Labs  Lab 10/11/19 2315 10/12/19 0140 10/12/19 0722  LATICACIDVEN 3.6* 2.2* 2.0*    Recent Results (from the past 240 hour(s))  SARS CORONAVIRUS 2 (TAT 6-24 HRS) Nasopharyngeal Nasopharyngeal Swab     Status: None   Collection Time: 10/12/19  1:12 AM   Specimen: Nasopharyngeal Swab  Result Value Ref Range Status   SARS Coronavirus 2 NEGATIVE NEGATIVE Final    Comment: (NOTE) SARS-CoV-2 target nucleic acids are NOT DETECTED. The SARS-CoV-2 RNA is generally detectable in upper and lower respiratory specimens during the acute phase of infection. Negative results do not preclude SARS-CoV-2 infection, do not rule out co-infections with other pathogens, and should not be used as the sole basis for treatment or other patient management decisions. Negative results must be combined with clinical observations, patient history, and epidemiological information. The expected result is Negative. Fact Sheet for Patients: SugarRoll.be Fact Sheet for Healthcare  Providers: https://www.woods-mathews.com/ This test is not yet approved or cleared by the [place] FDA and  has been authorized for detection and/or diagnosis of SARS-CoV-2 by FDA under an Emergency Use Authorization (EUA). This EUA will remain  in effect (meaning this test can be used) for the duration of the COVID-19 declaration under Section 56 4(b)(1) of the Act, 21 U.S.C. section 360bbb-3(b)(1), unless the authorization is terminated or revoked sooner. Performed at [facility], [place] [address] [place], [place] [address]          Radiology Studies: DG Chest Portable 1 View  Result Date: 10/11/2019 CLINICAL DATA:  86 year old male with fever and altered mental status. EXAM: PORTABLE CHEST 1 VIEW COMPARISON:  None. FINDINGS: Portable AP semi upright view at 2317 hours. Low lung volumes. Cardiac size at the upper limits of normal. Other mediastinal contours are within normal limits. Visualized tracheal air column is within normal limits. Allowing for portable technique the lungs are clear. No pneumothorax. No acute osseous abnormality identified. IMPRESSION: No acute cardiopulmonary abnormality. Low lung volumes and cardiac size at the upper limits of normal. Electronically Signed   By: Genevie Ann M.D.   On: 10/11/2019 23:29        Scheduled Meds: . insulin aspart  0-9 Units Subcutaneous Q4H  . sodium chloride flush  3 mL Intravenous Q12H  . sodium chloride flush  3 mL Intravenous Q12H   Continuous Infusions: . sodium chloride    . ceFEPime (MAXIPIME) IV Stopped (10/12/19 0258)  . famotidine (PEPCID) IV Stopped (10/12/19 0330)  . metronidazole Stopped (10/12/19 0258)  . vancomycin Stopped (10/12/19 0330)     LOS: 0 days    Time spent: 35 mins.More than 50% of that time was spent in counseling and/or coordination of care.      Shelly Coss, MD [facility] P3/27/2021, 8:37 AM

## 2019-10-12 NOTE — ED Notes (Signed)
Report given to Darryl, RN. All questions answered

## 2019-10-12 NOTE — ED Notes (Signed)
Pt spo2 88-92% on RA while sleeping, pt placed on 2 lpm O2 via [place]

## 2019-10-12 NOTE — H&P (Signed)
History and Physical    Derrick Ross [id number] DOB: Sep 25, 1930 DOA: 10/11/2019  PCP: Wenda Low, MD   Patient coming from: Home   Chief Complaint: Gen weakness   HPI: Derrick Ross is a 86 y.o. male with medical history significant for history of CVA, dementia, hypertension, type 2 diabetes mellitus, and atrial fibrillation on anticoagulation, who presented to the emergency department with generalized weakness and frequent urination.  Patient is accompanied by his son who assists with the history.  Family has noted that the patient has seemed more weak in general for the past few days and then slid off the toilet tonight.  He has also been urinating very frequently.  He has not appeared short of breath and his son has not seen him coughing.  Patient has not voiced any complaints and specifically denies any pain and reports that he feels fine.  He recently stubbed the toe on his right foot and has had a small amount of blood there but no wounds or rash.  He has a large left inguinal hernia but denies any pain there or discoloration.  No nausea, vomiting, or diarrhea.  No headache or neck stiffness.  ED Course: Upon arrival to the ED, patient is found to be febrile to 38.4 C, saturating low 90s on room air, and blood pressure 87/59.  EKG features atrial fibrillation with rate 58.  Chest x-ray negative for acute cardiopulmonary disease.  Chemistry panel with creatinine 1.45, total bilirubin 1.5, and no recent prior labs for comparison.  CBC with leukocytosis to 21,900.  Lactic acid is elevated to 3.6.  INR is 2.0.  Patient was given a liter of saline in the ED and Covid PCR was ordered.  Review of Systems:  All other systems reviewed and apart from HPI, are negative.  Past Medical History:  Diagnosis Date  . Atrial fibrillation ([place])   . CVD (cerebrovascular disease)   . DM (diabetes mellitus) ([place])   . Hearing loss   . HTN (hypertension)   . Inguinal hernia    huge  .  Neuropathy    mild  . Skin cancer   . Stroke [facility])    with arm weakness  . TIA (transient ischemic attack)    with aphasia  . UTI (lower urinary tract infection)     Past Surgical History:  Procedure Laterality Date  . bce removed from face    . cataracts     bilaterally removed     reports that he has never smoked. He has never used smokeless tobacco. No history on file for alcohol and drug.  No Known Allergies  Family History  Problem Relation Age of Onset  . Congestive Heart Failure Mother   . Other Father        cerebral embolism  . Cancer Sister        kidney and a smoker  . Acute myelogenous leukemia Brother      Prior to Admission medications   Medication Sig Start Date End Date Taking? Authorizing Provider  acetaminophen (TYLENOL) 500 MG tablet Take 500 mg by mouth every 6 (six) hours as needed.    [provider]  amLODipine (NORVASC) 10 MG tablet TK 1 T PO ONCE D 03/26/18   [provider]  amLODipine (NORVASC) 5 MG tablet Take 5 mg by mouth daily.    [provider]  apixaban (ELIQUIS) 2.5 MG TABS tablet Eliquis    [provider]  apixaban (ELIQUIS) 5 MG TABS  tablet Take 1 tablet (5 mg total) by mouth 2 (two) times daily. Take first dose at 10pm tonight and then 8:00 and 8:00 09/27/14   Barton Dubois, MD  atenolol (TENORMIN) 100 MG tablet Take 100 mg by mouth daily.    [provider]  benazepril (LOTENSIN) 40 MG tablet Take 40 mg by mouth daily.    [provider]  cloNIDine (CATAPRES) 0.2 MG tablet Take 0.2 mg by mouth 2 (two) times daily.    [provider]  Coenzyme Q10 (CO Q10 PO) Take by mouth.    [provider]  donepezil (ARICEPT) 5 MG tablet TAKE 1 TABLET BY MOUTH ONCE DAILY AT BEDTIME FOR 30 DAYS 01/27/18   [provider]  hydrochlorothiazide (HYDRODIURIL) 25 MG tablet Take 50 mg by mouth daily. Take 2 tablets once a day for a total of 50 mg.    [provider]    ketoconazole (NIZORAL) 2 % cream  APPLY ONE APPLICATION TOPICALLY DAILY , APPLY TO BOTH FEET AND BETWEEN TOES ONCE DAILY FOR 30 DAYS 05/11/18   [place], Stephani Police, DPM  memantine (NAMENDA) 10 MG tablet TAKE 1 TABLET BY MOUTH TWICE DAILY FOR 30 DAYS 04/22/18   [provider]  memantine (NAMENDA) 5 MG tablet Take 5 mg by mouth 2 (two) times daily.    [provider]  metFORMIN (GLUCOPHAGE) 500 MG tablet TK 1 T PO BID WITH MEALS 12/06/16   [provider]  potassium chloride SA (K-DUR,KLOR-CON) 20 MEQ tablet Take 20 mEq by mouth 2 (two) times daily.    [provider]  Prenatal Vit-Fe Fumarate-FA (M-VIT PO) Take 1 tablet by mouth daily.    [provider]  ranitidine (ZANTAC) 75 MG tablet Take 75 mg by mouth daily.    [provider]  simvastatin (ZOCOR) 20 MG tablet Take 20 mg by mouth daily.    [provider]  traMADol (ULTRAM) 50 MG tablet tramadol 50 mg tablet    [provider]    Physical Exam: Vitals:   10/11/19 2345 10/12/19 0000 10/12/19 0015 10/12/19 0030  BP: (!) 87/59 116/69 101/61 (!) 94/51  Pulse: 63 66 98 84  Resp: 15 16 15 15   Temp:      TempSrc:      SpO2: 94% 98% 94% (!) 88%  Weight:      Height:        Constitutional: NAD, calm  Eyes: PERTLA, lids and conjunctivae normal ENMT: Mucous membranes are moist. Posterior pharynx clear of any exudate or lesions.   Neck: normal, supple, no masses, no thyromegaly Respiratory:  no wheezing, no crackles. No accessory muscle use.  Cardiovascular: S1 & S2 heard, regular rate and rhythm. No extremity edema.   Abdomen: No distension, no tenderness, soft. Bowel sounds active.  Musculoskeletal: no clubbing / cyanosis. No joint deformity upper and lower extremities.   Skin: no significant rashes, lesions, ulcers. Warm, dry, well-perfused. Neurologic: Gross hearing loss, CN 2-12 grossly intact otherwise. Sensation intact, DTR normal. Moving all extremities.   Psychiatric: Alert and oriented to person and place only. Pleasant and cooperative.    Labs and Imaging on Admission: I have personally reviewed following labs and imaging studies  CBC: Recent Labs  Lab 10/11/19 2325  WBC 21.9*  NEUTROABS 19.8*  HGB 14.2  HCT 42.8  MCV 90.9  PLT [id number]   Basic Metabolic Panel: Recent Labs  Lab 10/11/19 2325  NA 140  K 4.2  CL 103  CO2  21*  GLUCOSE 153*  BUN 19  CREATININE 1.45*  CALCIUM 9.6   GFR: Estimated Creatinine Clearance: 41.3 mL/min (A) (by C-G formula based on SCr of 1.45 mg/dL (H)). Liver Function Tests: Recent Labs  Lab 10/11/19 2325  AST 20  ALT 15  ALKPHOS 72  BILITOT 1.5*  PROT 6.6  ALBUMIN 4.0   No results for input(s): LIPASE, AMYLASE in the last 168 hours. No results for input(s): AMMONIA in the last 168 hours. Coagulation Profile: Recent Labs  Lab 10/12/19 0140  INR 2.0*   Cardiac Enzymes: No results for input(s): CKTOTAL, CKMB, CKMBINDEX, TROPONINI in the last 168 hours. BNP (last 3 results) No results for input(s): PROBNP in the last 8760 hours. HbA1C: No results for input(s): HGBA1C in the last 72 hours. CBG: No results for input(s): GLUCAP in the last 168 hours. Lipid Profile: No results for input(s): CHOL, HDL, LDLCALC, TRIG, CHOLHDL, LDLDIRECT in the last 72 hours. Thyroid Function Tests: No results for input(s): TSH, T4TOTAL, FREET4, T3FREE, THYROIDAB in the last 72 hours. Anemia Panel: No results for input(s): VITAMINB12, FOLATE, FERRITIN, TIBC, IRON, RETICCTPCT in the last 72 hours. Urine analysis:    Component Value Date/Time   COLORURINE YELLOW 10/12/2019 0001   APPEARANCEUR HAZY (A) 10/12/2019 0001   LABSPEC 1.014 10/12/2019 0001   PHURINE 7.0 10/12/2019 0001   GLUCOSEU NEGATIVE 10/12/2019 0001   HGBUR LARGE (A) 10/12/2019 0001   BILIRUBINUR NEGATIVE 10/12/2019 0001   KETONESUR NEGATIVE 10/12/2019 0001   PROTEINUR 30 (A) 10/12/2019 0001   UROBILINOGEN 1.0 09/26/2014 1527    NITRITE NEGATIVE 10/12/2019 0001   LEUKOCYTESUR NEGATIVE 10/12/2019 0001   Sepsis Labs: @LABRCNTIP (procalcitonin:4,lacticidven:4) )No results found for this or any previous visit (from the past 240 hour(s)).   Radiological Exams on Admission: DG Chest Portable 1 View  Result Date: 10/11/2019 CLINICAL DATA:  86 year old male with fever and altered mental status. EXAM: PORTABLE CHEST 1 VIEW COMPARISON:  None. FINDINGS: Portable AP semi upright view at 2317 hours. Low lung volumes. Cardiac size at the upper limits of normal. Other mediastinal contours are within normal limits. Visualized tracheal air column is within normal limits. Allowing for portable technique the lungs are clear. No pneumothorax. No acute osseous abnormality identified. IMPRESSION: No acute cardiopulmonary abnormality. Low lung volumes and cardiac size at the upper limits of normal. Electronically Signed   By: Genevie Ann M.D.   On: 10/11/2019 23:29    EKG: Independently reviewed. Atrial fibrillation, rate 58.   Assessment/Plan   1. Sepsis  - Presents with gen weakness and found to have fever, SBP ~90, WBC 21.9k, lactate 3.6, and SCr of 1.45 with no recent priors  - CXR looks clear and no recent respiratory sxs; he has had urinary frequency but UA not suggestive of infection; abdominal exam benign; no meningismus  - Blood cultures collected in ED, urine culture will be added on  - BP improved with one liter in ED, will give second liter now and then complete the 30 cc/kg bolus if pending lactate still high  - Start broad-spectrum antibiotics, follow cultures and clinical course    2. Atrial fibrillation  - Rate is well controlled  - Previously on Eliquis, INR 2.0 and son not sure what he is taking now, pharmacy medication reconciliation pending   3. Type II DM  - No recent A1c, managed with oral agents at home  - Check CBGs and use a low-intensity SSI for now    4. Hypertension  - BP  is low in ED and antihypertensives  held on admission   5. Renal insufficiency  - SCr is 1.45 in ED with no recent labs to compare to  - He is being fluid-resuscitated in setting of SIRS/sepsis with low BP  - Renally-dose medications and repeat chem panel in am     DVT prophylaxis: Eliquis  Code Status: Full, confirmed with patient and son  Family Communication: Son updated at bedside Disposition Plan: Critically-ill at time of admission, even if recovers quickly, will likely need to watch cultures for at least 2 days prior to discharge planning   Consults called: None Admission status: Inpatient     Vianne Bulls, MD [facility] Pager: See [email or web address]  If 7AM-7PM, please contact the daytime attending [email or web address]  10/12/2019, 2:04 AM

## 2019-10-12 NOTE — ED Notes (Signed)
Lunch tray ordered 

## 2019-10-12 NOTE — ED Notes (Addendum)
Pt refuse lunch

## 2019-10-12 NOTE — ED Notes (Signed)
The pt is very confused  He ate a little of his food but wants to kedp it for later whenever he goes home

## 2019-10-12 NOTE — ED Notes (Signed)
I&O cath performed. Blood from cath trauma noted.

## 2019-10-13 LAB — CBC WITH DIFFERENTIAL/PLATELET
Abs Immature Granulocytes: 0.05 10*3/uL (ref 0.00–0.07)
Basophils Absolute: 0 10*3/uL (ref 0.0–0.1)
Basophils Relative: 0 %
Eosinophils Absolute: 0 10*3/uL (ref 0.0–0.5)
Eosinophils Relative: 0 %
HCT: 40 % (ref 39.0–52.0)
Hemoglobin: 13.4 g/dL (ref 13.0–17.0)
Immature Granulocytes: 0 %
Lymphocytes Relative: 10 %
Lymphs Abs: 1.1 10*3/uL (ref 0.7–4.0)
MCH: 29.8 pg (ref 26.0–34.0)
MCHC: 33.5 g/dL (ref 30.0–36.0)
MCV: 88.9 fL (ref 80.0–100.0)
Monocytes Absolute: 1.1 10*3/uL — ABNORMAL HIGH (ref 0.1–1.0)
Monocytes Relative: 9 %
Neutro Abs: 9.5 10*3/uL — ABNORMAL HIGH (ref 1.7–7.7)
Neutrophils Relative %: 81 %
Platelets: 175 10*3/uL (ref 150–400)
RBC: 4.5 MIL/uL (ref 4.22–5.81)
RDW: 13.5 % (ref 11.5–15.5)
WBC: 11.8 10*3/uL — ABNORMAL HIGH (ref 4.0–10.5)
nRBC: 0 % (ref 0.0–0.2)

## 2019-10-13 LAB — BASIC METABOLIC PANEL
Anion gap: 13 (ref 5–15)
BUN: 23 mg/dL (ref 8–23)
CO2: 20 mmol/L — ABNORMAL LOW (ref 22–32)
Calcium: 8.9 mg/dL (ref 8.9–10.3)
Chloride: 105 mmol/L (ref 98–111)
Creatinine, Ser: 1.13 mg/dL (ref 0.61–1.24)
GFR calc Af Amer: >60 mL/min (ref >60)
GFR calc non Af Amer: 59 mL/min — ABNORMAL LOW (ref >60)
Glucose, Bld: 119 mg/dL — ABNORMAL HIGH (ref 70–99)
Potassium: 3.4 mmol/L — ABNORMAL LOW (ref 3.5–5.1)
Sodium: 138 mmol/L (ref 135–145)

## 2019-10-13 LAB — LACTIC ACID, PLASMA: Lactic Acid, Venous: 1.3 mmol/L (ref 0.5–1.9)

## 2019-10-13 LAB — MAGNESIUM
Magnesium: 1.7 mg/dL (ref 1.7–2.4)
Magnesium: 1.7 mg/dL (ref 1.7–2.4)

## 2019-10-13 LAB — GLUCOSE, CAPILLARY
Glucose-Capillary: 116 mg/dL — ABNORMAL HIGH (ref 70–99)
Glucose-Capillary: 121 mg/dL — ABNORMAL HIGH (ref 70–99)
Glucose-Capillary: 109 mg/dL — ABNORMAL HIGH (ref 70–99)
Glucose-Capillary: 128 mg/dL — ABNORMAL HIGH (ref 70–99)
Glucose-Capillary: 211 mg/dL — ABNORMAL HIGH (ref 70–99)

## 2019-10-13 MED ORDER — LORAZEPAM 2 MG/ML IJ SOLN
1.0000 mg | Freq: Once | INTRAMUSCULAR | Status: AC
  Administered 2019-10-14: 1 mg via INTRAVENOUS
  Filled 2019-10-13: qty 1

## 2019-10-13 MED ORDER — TAMSULOSIN HCL 0.4 MG PO CAPS
0.4000 mg | ORAL_CAPSULE | Freq: Every day | ORAL | Status: DC
  Administered 2019-10-13 – 2019-10-16 (×4): 0.4 mg via ORAL
  Filled 2019-10-13 (×3): qty 1

## 2019-10-13 MED ORDER — POTASSIUM CHLORIDE CRYS ER 20 MEQ PO TBCR
40.0000 meq | EXTENDED_RELEASE_TABLET | Freq: Once | ORAL | Status: AC
  Administered 2019-10-13: 40 meq via ORAL
  Filled 2019-10-13: qty 2

## 2019-10-13 MED ORDER — MAGNESIUM SULFATE 4 GM/100ML IV SOLN
4.0000 g | Freq: Once | INTRAVENOUS | Status: AC
  Administered 2019-10-13: 4 g via INTRAVENOUS
  Filled 2019-10-13: qty 100

## 2019-10-13 MED ORDER — FAMOTIDINE 20 MG PO TABS
20.0000 mg | ORAL_TABLET | Freq: Every day | ORAL | Status: DC
  Administered 2019-10-14 – 2019-10-16 (×3): 20 mg via ORAL
  Filled 2019-10-13 (×3): qty 1

## 2019-10-13 NOTE — Evaluation (Signed)
Occupational Therapy Evaluation Patient Details Name: CEEJAY MARRIN MRN: [id number] DOB: [birth year] Today's Date: 10/13/2019    History of Present Illness 86 y.o. male with medical history significant for history of CVA, dementia, hypertension, type 2 diabetes mellitus, and atrial fibrillation on anticoagulation, who presented to the emergency department with generalized weakness and frequent urination. Pt diagnosed with fever, sepsis and A-fib   Clinical Impression   Patient is an 86 year old male, pleasantly confused during OT evaluation and unable to provide reliable PLOF with no family present at the time. Initially patient stating his name is Iona Beard when prompted, but when OT states "your wrist band says you're Therman" patient is agreeable to this. Currently patient requires extensive assist for bed mobility, functional transfers and ADLs from mod to maximal assist. Pt also require multimodal cues to sequence and follow 1 step commands. Currently recommending SNF unless family can provide 24/7 assistance at current level of care. Will continue to follow with acute OT.     Follow Up Recommendations  SNF;Supervision/Assistance - 24 hour    Equipment Recommendations  Other (comment)(defer to next venue)       Precautions / Restrictions Precautions Precautions: Fall Restrictions Weight Bearing Restrictions: No      Mobility Bed Mobility Overal bed mobility: Needs Assistance Bed Mobility: Supine to Sit;Sit to Supine     Supine to sit: Mod assist;HOB elevated Sit to supine: Mod assist   General bed mobility comments: mod A for trunk support and cues for sequencing to sit up, mod A to lift LEs onto bed to return to supine  Transfers Overall transfer level: Needs assistance Equipment used: Rolling walker (2 wheeled) Transfers: Sit to/from Stand Sit to Stand: Mod assist;Max assist;From elevated surface         General transfer comment: mod/max A to power up to standing with  initial posterior lean, cue to weight bear through arms and distribute weight through forefoot vs back on heels.    Balance Overall balance assessment: Needs assistance Sitting-balance support: Bilateral upper extremity supported;Feet supported Sitting balance-Leahy Scale: Fair Sitting balance - Comments: initial posterior lean, with repositioning able to maintain   Standing balance support: Bilateral upper extremity supported;During functional activity Standing balance-Leahy Scale: Poor Standing balance comment: reliant on external support and OT                           ADL either performed or assessed with clinical judgement   ADL Overall ADL's : Needs assistance/impaired     Grooming: Wash/dry face;Set up;Bed level   Upper Body Bathing: Minimal assistance;Sitting   Lower Body Bathing: Maximal assistance;Sitting/lateral leans;Bed level   Upper Body Dressing : Minimal assistance;Sitting   Lower Body Dressing: Maximal assistance;Total assistance;Sitting/lateral leans Lower Body Dressing Details (indicate cue type and reason): to don socks seated edge of bed, upon initially sitting up pt having posterior loss of balance Toilet Transfer: Moderate assistance;Maximal assistance;Stand-pivot;Cueing for safety;BSC;RW Toilet Transfer Details (indicate cue type and reason): simulated with functional mobility, requires mod to max cues for side stepping to head of bed Toileting- Clothing Manipulation and Hygiene: Total assistance;Sitting/lateral lean;Sit to/from stand       Functional mobility during ADLs: Moderate assistance;Maximal assistance;Rolling walker;Cueing for safety;Cueing for sequencing General ADL Comments: unsure of patient's baseline level however patient is requiring extensive assist for self care tasks due to decreased cognition, safety awareness, activity tolerance, balance, strength  Pertinent Vitals/Pain Pain Assessment: Faces Faces  Pain Scale: No hurt     Hand Dominance Right   Extremity/Trunk Assessment Upper Extremity Assessment Upper Extremity Assessment: Generalized weakness   Lower Extremity Assessment Lower Extremity Assessment: Defer to PT evaluation   Cervical / Trunk Assessment Cervical / Trunk Assessment: Kyphotic   Communication Communication Communication: HOH   Cognition Arousal/Alertness: Awake/alert Behavior During Therapy: WFL for tasks assessed/performed Overall Cognitive Status: No family/caregiver present to determine baseline cognitive functioning Area of Impairment: Orientation;Memory;Following commands;Safety/judgement;Awareness;Problem solving                 Orientation Level: Disoriented to;Person;Place;Time;Situation(initially stating his name is Iona Beard)   Memory: Decreased short-term memory Following Commands: Follows one step commands with increased time Safety/Judgement: Decreased awareness of safety;Decreased awareness of deficits Awareness: Intellectual Problem Solving: Slow processing;Requires verbal cues;Requires tactile cues General Comments: patietn does follow 1 step commands with increased time may partially be due to [facility] however with fatigue patient had increased difficulty sequencing, carrying out directions.    General Comments  VSS             Home Living Family/patient expects to be discharged to:: Skilled nursing facility                                 Additional Comments: no family present to provide history and patient is unreliable narrator, pt does state he lives alone.      Prior Functioning/Environment          Comments: no family present to provide info, patient unreliable narrator        OT Problem List: Decreased strength;Decreased activity tolerance;Impaired balance (sitting and/or standing);Decreased cognition;Decreased safety awareness      OT Treatment/Interventions: Self-care/ADL training;Therapeutic  exercise;Energy conservation;DME and/or AE instruction;Therapeutic activities;Cognitive remediation/compensation;Patient/family education;Balance training    OT Goals(Current goals can be found in the care plan section) Acute Rehab OT Goals Patient Stated Goal: "to sleep" OT Goal Formulation: With patient Time For Goal Achievement: 10/27/19 Potential to Achieve Goals: Good  OT Frequency: Min 2X/week    AM-PAC OT "6 Clicks" Daily Activity     Outcome Measure Help from another person eating meals?: A Little Help from another person taking care of personal grooming?: A Little Help from another person toileting, which includes using toliet, bedpan, or urinal?: A Lot Help from another person bathing (including washing, rinsing, drying)?: A Lot Help from another person to put on and taking off regular upper body clothing?: A Little Help from another person to put on and taking off regular lower body clothing?: A Lot 6 Click Score: 15   End of Session Equipment Utilized During Treatment: Rolling walker  Activity Tolerance: Patient tolerated treatment well Patient left: in bed;with call bell/phone within reach;with bed alarm set;Other (comment)(B mits re-applied)  OT Visit Diagnosis: Other abnormalities of gait and mobility (R26.89);Muscle weakness (generalized) (M62.81);Other symptoms and signs involving cognitive function                Time: [id number] OT Time Calculation (min): 25 min Charges:  OT General Charges $OT Visit: 1 Visit OT Evaluation $OT Eval Moderate Complexity: 1 Mod OT Treatments $Self Care/Home Management : 8-22 mins  Shon Millet OT OT office: [place] 10/13/2019, 9:46 AM

## 2019-10-13 NOTE — Progress Notes (Signed)
PROGRESS NOTE    Derrick Ross  [id number] DOB: 14-May-1931 DOA: 10/11/2019 PCP: Wenda Low, MD   Brief Narrative: Patient is a 86 year old male with history of CVA, dementia, hypertension, diabetes type 2, A. fib on Eliquis who presents to the emergency department with complaints of generalized weakness, frequent urination.  Patient was found to be more weak in general for last few days.  On presentation, he was found to be febrile, hypotensive.  Chest x-ray did not show any pneumonia.  Found to have leukocytosis, lactic acidosis.  Sepsis suspected and started on broad spectrum antibiotics.  Cultures sent  Assessment & Plan:   Principal Problem:   Sepsis ([place]) Active Problems:   Atrial fibrillation ([place])   History of hypertension   History of ischemic stroke   DM (diabetes mellitus) ([place])   Dementia ([place])   Renal insufficiency   Sepsis: Presented with generalized weakness, fever, hypotension, leukocytosis, lactic acidosis, AKI.  Chest x-ray did not show pneumonia.  Urinalysis is not impressive for UTI.  No abdominal tenderness.  Cultures have been sent.  Started on broad start antibiotics which we will continue for now.   Blood pressure is better now.  Leukocytosis has improved.  Patient is afebrile currently  Permanent A. fib: Currently rate is well controlled. On Eliquis.    AKI vs CkD stage 2: No recent labs to compare.  Started on gentle IV fluids with improvement in the kidney function.  IV fluids discontinued.  Diabetes type 2: On oral agents at home.  Continue sliding scale  insulin for now.  Monitor CBGs.  Hemoglobin A1c of 6.5  Hypertension: Takes multiple medications at home.  Blood pressure was soft on presentation.  Antihypertensives on hold.  Dementia: Pretty advanced.  Confused at baseline.  Continue supportive care.  He has history of CVA.  Large left inguinal hernia: Likely incarcerated.Chronic. Has been there for several years. Follow-up with general  surgery as an outpatient.  Hypokalemia/Hypomagnesemia: Supplemented   Urinary retention: Foley catheter was placed because he retained urine last night.  Will start on Flomax.  We will give a voiding trial.  Debilty/deconditioning: Walks with cane/walker. PT/OT evaluation, recommend skilled nursing facility        DVT prophylaxis:Eliquis Code Status: Full Family Communication: Called daughter on 10/12/19 Disposition Plan: Patient is from home.  PT/OT recommending skilled facility.  He is hemodynamically stable for discharge to skilled nursing facility soon as bed is available.  We will continue IV antibiotics for now  Consultants: None  Procedures:None  Antimicrobials:  Anti-infectives (From admission, onward)   Start     Dose/Rate Route Frequency Ordered Stop   10/12/19 0200  metroNIDAZOLE (FLAGYL) IVPB 500 mg  Status:  Discontinued     500 mg 100 mL/hr over 60 Minutes Intravenous Every 8 hours 10/12/19 0124 10/12/19 0126   10/12/19 0200  metroNIDAZOLE (FLAGYL) IVPB 500 mg     500 mg 100 mL/hr over 60 Minutes Intravenous Every 8 hours 10/12/19 0127     10/12/19 0130  vancomycin (VANCOCIN) IVPB 1000 mg/200 mL premix  Status:  Discontinued     1,000 mg 200 mL/hr over 60 Minutes Intravenous  Once 10/12/19 0121 10/12/19 0129   10/12/19 0130  ceFEPIme (MAXIPIME) 1 g in sodium chloride 0.9 % 100 mL IVPB  Status:  Discontinued     1 g 200 mL/hr over 30 Minutes Intravenous  Once 10/12/19 0121 10/12/19 0125   10/12/19 0130  metroNIDAZOLE (FLAGYL) IVPB 500 mg  Status:  Discontinued  500 mg 100 mL/hr over 60 Minutes Intravenous  Once 10/12/19 0121 10/12/19 0126   10/12/19 0130  ceFEPIme (MAXIPIME) 2 g in sodium chloride 0.9 % 100 mL IVPB     2 g 200 mL/hr over 30 Minutes Intravenous Every 12 hours 10/12/19 0124     10/12/19 0130  vancomycin (VANCOCIN) IVPB 1000 mg/200 mL premix  Status:  Discontinued     1,000 mg 200 mL/hr over 60 Minutes Intravenous  Once 10/12/19 0124 10/12/19  0126   10/12/19 0130  vancomycin (VANCOREADY) IVPB 1250 mg/250 mL     1,250 mg 166.7 mL/hr over 90 Minutes Intravenous Every 24 hours 10/12/19 0129        Subjective:  Patient seen and examined at the bedside this morning.  Hemodynamically stable.  Afebrile today.  Looks comfortable.  Objective: Vitals:   10/12/19 1944 10/12/19 2013 10/12/19 2355 10/13/19 0415  BP: 129/74 127/82 133/76 (!) 136/92  Pulse:  94 96 71  Resp: 17 (!) 26 20 15   Temp:  98 F (36.7 C) 98.2 F (36.8 C) 98.2 F (36.8 C)  TempSrc:  Oral Oral Oral  SpO2: 97% 91% 92% 94%  Weight:      Height:        Intake/Output Summary (Last 24 hours) at 10/13/2019 [id number] Last data filed at 10/13/2019 0421 Gross per 24 hour  Intake 1150 ml  Output 550 ml  Net 600 ml   Filed Weights   10/11/19 2304  Weight: 88.5 kg    Examination:  General exam: Elderly pleasantly confused male Respiratory system: no wheezes or crackles  Cardiovascular system: S1 & S2 heard, RRR. No JVD, murmurs, rubs, gallops or clicks. Gastrointestinal system: Abdomen is nondistended, soft and nontender. No organomegaly or masses felt. Normal bowel sounds heard. Central nervous system: Alert and awake, confused at baseline  extremities: No edema, no clubbing ,no cyanosis, distal peripheral pulses palpable. Skin: No rashes, lesions or ulcers,no icterus ,no pallor GU: Foley, large left-sided left inguinal hernia   Data Reviewed: I have personally reviewed following labs and imaging studies  CBC: Recent Labs  Lab 10/11/19 2325 10/12/19 0340 10/13/19 0436  WBC 21.9* 19.8* 11.8*  NEUTROABS 19.8* 17.3* 9.5*  HGB 14.2 13.0 13.4  HCT 42.8 39.8 40.0  MCV 90.9 92.1 88.9  PLT 221 179 0000000   Basic Metabolic Panel: Recent Labs  Lab 10/11/19 2325 10/12/19 0340 10/12/19 0722 10/13/19 0436  NA 140 141  --  138  K 4.2 4.3  --  3.4*  CL 103 106  --  105  CO2 21* 22  --  20*  GLUCOSE 153* 134*  --  119*  BUN 19 22  --  23  CREATININE 1.45*  1.34*  --  1.13  CALCIUM 9.6 9.1  --  8.9  MG  --   --  1.6* 1.7   GFR: Estimated Creatinine Clearance: 53 mL/min (by C-G formula based on SCr of 1.13 mg/dL). Liver Function Tests: Recent Labs  Lab 10/11/19 2325 10/12/19 0340  AST 20 16  ALT 15 15  ALKPHOS 72 61  BILITOT 1.5* 1.8*  PROT 6.6 5.8*  ALBUMIN 4.0 3.4*   No results for input(s): LIPASE, AMYLASE in the last 168 hours. No results for input(s): AMMONIA in the last 168 hours. Coagulation Profile: Recent Labs  Lab 10/12/19 0140  INR 2.0*   Cardiac Enzymes: No results for input(s): CKTOTAL, CKMB, CKMBINDEX, TROPONINI in the last 168 hours. BNP (last 3 results) No results for  input(s): PROBNP in the last 8760 hours. HbA1C: Recent Labs    10/12/19 0722  HGBA1C 6.5*   CBG: Recent Labs  Lab 10/12/19 1227 10/12/19 1945 10/12/19 2040 10/12/19 2317 10/13/19 0417  GLUCAP 103* 157* 138* 106* 116*   Lipid Profile: No results for input(s): CHOL, HDL, LDLCALC, TRIG, CHOLHDL, LDLDIRECT in the last 72 hours. Thyroid Function Tests: No results for input(s): TSH, T4TOTAL, FREET4, T3FREE, THYROIDAB in the last 72 hours. Anemia Panel: No results for input(s): VITAMINB12, FOLATE, FERRITIN, TIBC, IRON, RETICCTPCT in the last 72 hours. Sepsis Labs: Recent Labs  Lab 10/11/19 2315 10/12/19 0140 10/12/19 0722 10/13/19 0436  LATICACIDVEN 3.6* 2.2* 2.0* 1.3    Recent Results (from the past 240 hour(s))  Blood culture (routine x 2)     Status: None (Preliminary result)   Collection Time: 10/11/19 11:20 PM   Specimen: BLOOD LEFT WRIST  Result Value Ref Range Status   Specimen Description BLOOD LEFT WRIST  Final   Special Requests   Final    BOTTLES DRAWN AEROBIC AND ANAEROBIC Blood Culture adequate volume   Culture   Final    NO GROWTH < 24 HOURS Performed at [facility], [place] [address] [place], [place] [address]    Report Status PENDING  Incomplete  Blood culture (routine x 2)     Status: None (Preliminary  result)   Collection Time: 10/11/19 11:25 PM   Specimen: BLOOD  Result Value Ref Range Status   Specimen Description BLOOD LEFT ANTECUBITAL  Final   Special Requests   Final    BOTTLES DRAWN AEROBIC AND ANAEROBIC Blood Culture adequate volume   Culture   Final    NO GROWTH < 24 HOURS Performed at [facility], [place] [address], [place], [place] [address]    Report Status PENDING  Incomplete  SARS CORONAVIRUS 2 (TAT 6-24 HRS) Nasopharyngeal Nasopharyngeal Swab     Status: None   Collection Time: 10/12/19  1:12 AM   Specimen: Nasopharyngeal Swab  Result Value Ref Range Status   SARS Coronavirus 2 NEGATIVE NEGATIVE Final    Comment: (NOTE) SARS-CoV-2 target nucleic acids are NOT DETECTED. The SARS-CoV-2 RNA is generally detectable in upper and lower respiratory specimens during the acute phase of infection. Negative results do not preclude SARS-CoV-2 infection, do not rule out co-infections with other pathogens, and should not be used as the sole basis for treatment or other patient management decisions. Negative results must be combined with clinical observations, patient history, and epidemiological information. The expected result is Negative. Fact Sheet for Patients: SugarRoll.be Fact Sheet for Healthcare Providers: https://www.woods-mathews.com/ This test is not yet approved or cleared by the [place] FDA and  has been authorized for detection and/or diagnosis of SARS-CoV-2 by FDA under an Emergency Use Authorization (EUA). This EUA will remain  in effect (meaning this test can be used) for the duration of the COVID-19 declaration under Section 56 4(b)(1) of the Act, 21 U.S.C. section 360bbb-3(b)(1), unless the authorization is terminated or revoked sooner. Performed at [facility], [place] [address] [place], [place] [address]          Radiology Studies: DG Chest Portable 1 View  Result Date: 10/11/2019 CLINICAL  DATA:  86 year old male with fever and altered mental status. EXAM: PORTABLE CHEST 1 VIEW COMPARISON:  None. FINDINGS: Portable AP semi upright view at 2317 hours. Low lung volumes. Cardiac size at the upper limits of normal. Other mediastinal contours are within normal limits.  Visualized tracheal air column is within normal limits. Allowing for portable technique the lungs are clear. No pneumothorax. No acute osseous abnormality identified. IMPRESSION: No acute cardiopulmonary abnormality. Low lung volumes and cardiac size at the upper limits of normal. Electronically Signed   By: Genevie Ann M.D.   On: 10/11/2019 23:29        Scheduled Meds: . apixaban  5 mg Oral BID  . Chlorhexidine Gluconate Cloth  6 each Topical Daily  . insulin aspart  0-9 Units Subcutaneous Q4H  . potassium chloride  40 mEq Oral Once  . sodium chloride flush  3 mL Intravenous Q12H  . sodium chloride flush  3 mL Intravenous Q12H   Continuous Infusions: . sodium chloride    . ceFEPime (MAXIPIME) IV 2 g (10/12/19 2319)  . famotidine (PEPCID) IV Stopped (10/12/19 0330)  . metronidazole 500 mg (10/13/19 0303)  . vancomycin 1,250 mg (10/13/19 0049)     LOS: 1 day    Time spent: 35 mins.More than 50% of that time was spent in counseling and/or coordination of care.      Shelly Coss, MD [facility] P3/28/2021, 7:52 AM

## 2019-10-13 NOTE — Evaluation (Signed)
Physical Therapy Evaluation Patient Details Name: Derrick Ross MRN: [id number] DOB: 03/02/1931 Today's Date: 10/13/2019   History of Present Illness  86 y.o. male with medical history significant for history of CVA, dementia, hypertension, type 2 diabetes mellitus, and atrial fibrillation on anticoagulation, who presented to the emergency department with generalized weakness and frequent urination. Pt diagnosed with fever, sepsis and A-fib    Clinical Impression  Pt admitted with above diagnosis. On eval, pt required mod assist bed mobility and mod/max assist sit to stand with RW. Pt very confused. Disoriented to person, place and time. Inconsistently following commands. Session also limited by lethargy.  Pt currently with functional limitations due to the deficits listed below (see PT Problem List). Pt will benefit from skilled PT to increase their independence and safety with mobility to allow discharge to the venue listed below.       Follow Up Recommendations SNF    Equipment Recommendations  Other (comment)(TBD)    Recommendations for Other Services       Precautions / Restrictions Precautions Precautions: Fall Restrictions Weight Bearing Restrictions: No      Mobility  Bed Mobility Overal bed mobility: Needs Assistance Bed Mobility: Supine to Sit;Sit to Supine     Supine to sit: Mod assist;HOB elevated Sit to supine: Mod assist;HOB elevated   General bed mobility comments: unable to progress beyond EOB due to pt continuous attempts to return to supine.  Transfers Overall transfer level: Needs assistance Equipment used: Rolling walker (2 wheeled) Transfers: Sit to/from Stand Sit to Stand: Mod assist;Max assist;From elevated surface         General transfer comment: mod/max A to power up to standing with initial posterior lean, cue to weight bear through arms and distribute weight through forefoot vs back on heels.  Ambulation/Gait                 Stairs            Wheelchair Mobility    Modified Rankin (Stroke Patients Only)       Balance Overall balance assessment: Needs assistance Sitting-balance support: Feet supported;No upper extremity supported Sitting balance-Leahy Scale: Fair Sitting balance - Comments: initial posterior lean, with repositioning able to maintain   Standing balance support: Bilateral upper extremity supported;During functional activity Standing balance-Leahy Scale: Poor Standing balance comment: reliant on external support and OT                             Pertinent Vitals/Pain Pain Assessment: Faces Faces Pain Scale: Hurts a little bit Pain Location: generalized with mobility Pain Descriptors / Indicators: Grimacing;Discomfort Pain Intervention(s): Limited activity within patient's tolerance;Repositioned    Home Living Family/patient expects to be discharged to:: Skilled nursing facility                 Additional Comments: No family present to provide history and pt is a poor historian. Pt lives at home with wife per RN.    Prior Function           Comments: Unsure of PLOF. No family present. No previous history in chart.     Hand Dominance   Dominant Hand: Right    Extremity/Trunk Assessment   Upper Extremity Assessment Upper Extremity Assessment: Defer to OT evaluation    Lower Extremity Assessment Lower Extremity Assessment: Generalized weakness    Cervical / Trunk Assessment Cervical / Trunk Assessment: Kyphotic  Communication   Communication: [facility]  Cognition Arousal/Alertness: Lethargic Behavior During Therapy: Impulsive Overall Cognitive Status: No family/caregiver present to determine baseline cognitive functioning Area of Impairment: Orientation;Memory;Following commands;Safety/judgement;Awareness;Problem solving;Attention                 Orientation Level: Disoriented to;Person;Place;Time;Situation Current Attention Level:  Focused Memory: Decreased short-term memory Following Commands: Follows one step commands inconsistently;Follows one step commands with increased time Safety/Judgement: Decreased awareness of safety;Decreased awareness of deficits Awareness: Intellectual Problem Solving: Slow processing;Requires verbal cues;Requires tactile cues;Difficulty sequencing General Comments: Garbled, mainly unintelligble speech. Lethargic. Falling asleep without continuous stimulation/cues.      General Comments General comments (skin integrity, edema, etc.): VSS, rudy/flushed complexion    Exercises     Assessment/Plan    PT Assessment Patient needs continued PT services  PT Problem List Decreased strength;Decreased mobility;Decreased safety awareness;Decreased activity tolerance;Decreased cognition;Pain;Decreased balance;Decreased knowledge of use of DME       PT Treatment Interventions DME instruction;Therapeutic activities;Cognitive remediation;Gait training;Therapeutic exercise;Patient/family education;Balance training;Functional mobility training    PT Goals (Current goals can be found in the Care Plan section)  Acute Rehab PT Goals Patient Stated Goal: not stated PT Goal Formulation: Patient unable to participate in goal setting Time For Goal Achievement: 10/27/19 Potential to Achieve Goals: Fair    Frequency Min 2X/week   Barriers to discharge        Co-evaluation               AM-PAC PT "6 Clicks" Mobility  Outcome Measure Help needed turning from your back to your side while in a flat bed without using bedrails?: A Little Help needed moving from lying on your back to sitting on the side of a flat bed without using bedrails?: A Lot Help needed moving to and from a bed to a chair (including a wheelchair)?: A Lot Help needed standing up from a chair using your arms (e.g., wheelchair or bedside chair)?: A Lot Help needed to walk in hospital room?: Total Help needed climbing 3-5  steps with a railing? : Total 6 Click Score: 11    End of Session   Activity Tolerance: Patient limited by lethargy Patient left: in bed;with call bell/phone within reach;with bed alarm set;with nursing/sitter in room;with restraints reapplied Nurse Communication: Mobility status PT Visit Diagnosis: Other abnormalities of gait and mobility (R26.89);Muscle weakness (generalized) (M62.81)    Time: [id number] PT Time Calculation (min) (ACUTE ONLY): 11 min   Charges:   PT Evaluation $PT Eval Moderate Complexity: 1 Mod          Lorrin Goodell, PT  Office # [phone] Pager [phone]   Lorriane Shire 10/13/2019, 12:12 PM

## 2019-10-14 ENCOUNTER — Other Ambulatory Visit: Payer: Self-pay

## 2019-10-14 LAB — BASIC METABOLIC PANEL
Anion gap: 15 (ref 5–15)
BUN: 23 mg/dL (ref 8–23)
CO2: 21 mmol/L — ABNORMAL LOW (ref 22–32)
Calcium: 8.8 mg/dL — ABNORMAL LOW (ref 8.9–10.3)
Chloride: 101 mmol/L (ref 98–111)
Creatinine, Ser: 1.11 mg/dL (ref 0.61–1.24)
GFR calc Af Amer: >60 mL/min (ref >60)
GFR calc non Af Amer: 60 mL/min — ABNORMAL LOW (ref >60)
Glucose, Bld: 123 mg/dL — ABNORMAL HIGH (ref 70–99)
Potassium: 3.6 mmol/L (ref 3.5–5.1)
Sodium: 137 mmol/L (ref 135–145)

## 2019-10-14 LAB — GLUCOSE, CAPILLARY
Glucose-Capillary: 114 mg/dL — ABNORMAL HIGH (ref 70–99)
Glucose-Capillary: 116 mg/dL — ABNORMAL HIGH (ref 70–99)
Glucose-Capillary: 124 mg/dL — ABNORMAL HIGH (ref 70–99)
Glucose-Capillary: 135 mg/dL — ABNORMAL HIGH (ref 70–99)
Glucose-Capillary: 146 mg/dL — ABNORMAL HIGH (ref 70–99)
Glucose-Capillary: 235 mg/dL — ABNORMAL HIGH (ref 70–99)

## 2019-10-14 MED ORDER — POTASSIUM CHLORIDE CRYS ER 20 MEQ PO TBCR
20.0000 meq | EXTENDED_RELEASE_TABLET | Freq: Every day | ORAL | Status: DC
  Administered 2019-10-14 – 2019-10-16 (×3): 20 meq via ORAL
  Filled 2019-10-14 (×3): qty 1

## 2019-10-14 MED ORDER — AMOXICILLIN-POT CLAVULANATE 875-125 MG PO TABS
1.0000 | ORAL_TABLET | Freq: Two times a day (BID) | ORAL | Status: DC
  Administered 2019-10-14 – 2019-10-16 (×5): 1 via ORAL
  Filled 2019-10-14 (×5): qty 1

## 2019-10-14 NOTE — Plan of Care (Signed)

## 2019-10-14 NOTE — Progress Notes (Signed)
Family concerned pt is declining, not acting like himself. Pt having some labored breathing 24 breaths per minute however O2 98%, lungs just sound diminished. Pt can answer some questions, says his stomach is hurting. [facility] RN called, to see if they could come lay eyes on him. They will be up as soon as possible.

## 2019-10-14 NOTE — Social Work (Addendum)
CSW acknowledges consult for SNF. Patient currently in restrains/mittens and will need to be w/o restraints for 24 hrs before discharging to SNF.   CSW called patient's spouse [name]- she requested I call their daugter,[name]. CSW left voice message to contact CSW .   Thurmond Butts, MSW, [place] Clinical Social Worker

## 2019-10-14 NOTE — Progress Notes (Addendum)
PROGRESS NOTE    Derrick Ross  [id number] DOB: 1931/07/09 DOA: 10/11/2019 PCP: Wenda Low, MD   Brief Narrative: Patient is a 86 year old male with history of CVA, dementia, hypertension, diabetes type 2, A. fib on Eliquis who presents to the emergency department with complaints of generalized weakness, frequent urination.  Patient was found to be more weak in general for last few days.  On presentation, he was found to be febrile, hypotensive.  Chest x-ray did not show any pneumonia.  Found to have leukocytosis, lactic acidosis.  Sepsis suspected and started on broad spectrum antibiotics.  He remains hemodynamically stable.  Antibiotics has been changed to oral.  PT/OT recommended skilled nursing facility.  Patient is hemodynamically stable for discharge to skilled nursing facility as soon as bed is available.  Assessment & Plan:   Principal Problem:   Sepsis ([place]) Active Problems:   Atrial fibrillation ([place])   History of hypertension   History of ischemic stroke   DM (diabetes mellitus) ([place])   Dementia ([place])   Renal insufficiency   Sepsis: Presented with generalized weakness, fever, hypotension, leukocytosis, lactic acidosis, AKI.  Chest x-ray did not show pneumonia.  Urinalysis is not impressive for UTI.  No abdominal tenderness.  Cultures have been sent,NGTD.  Started on broad start antibiotics ,now descalated  to oral antibiotics Blood pressure is better now.  Leukocytosis has improved.  Patient is afebrile currently  Permanent A. fib: Currently rate is well controlled. On Eliquis.    AKI vs CkD stage 2: No recent labs to compare.  Started on gentle IV fluids with improvement in the kidney function.  IV fluids discontinued.  Diabetes type 2: On oral agents at home.  Continue sliding scale  insulin for now.  Monitor CBGs.  Hemoglobin A1c of 6.5  Hypertension: Takes multiple medications at home.  Blood pressure was soft on presentation.  Antihypertensives on  hold.  Dementia: Pretty advanced.  Confused at baseline.  Continue supportive care.  He has history of CVA.  Large left inguinal hernia: Likely incarcerated.Chronic. Has been there for several years. Follow-up with general surgery as an outpatient.  Hypokalemia/Hypomagnesemia: Supplemented   Urinary retention:resolved.Started on flomax.  Debilty/deconditioning: Walks with cane/walker. PT/OT evaluation, recommend skilled nursing facility        DVT prophylaxis:Eliquis Code Status: Full Family Communication: Called daughter on 10/14/19 for update on phone Disposition Plan: Patient is from home.  PT/OT recommending skilled facility.  He is hemodynamically stable for discharge to skilled nursing facility soon as bed is available.    Consultants: None  Procedures:None  Antimicrobials:  Anti-infectives (From admission, onward)   Start     Dose/Rate Route Frequency Ordered Stop   10/12/19 0200  metroNIDAZOLE (FLAGYL) IVPB 500 mg  Status:  Discontinued     500 mg 100 mL/hr over 60 Minutes Intravenous Every 8 hours 10/12/19 0124 10/12/19 0126   10/12/19 0200  metroNIDAZOLE (FLAGYL) IVPB 500 mg     500 mg 100 mL/hr over 60 Minutes Intravenous Every 8 hours 10/12/19 0127     10/12/19 0130  vancomycin (VANCOCIN) IVPB 1000 mg/200 mL premix  Status:  Discontinued     1,000 mg 200 mL/hr over 60 Minutes Intravenous  Once 10/12/19 0121 10/12/19 0129   10/12/19 0130  ceFEPIme (MAXIPIME) 1 g in sodium chloride 0.9 % 100 mL IVPB  Status:  Discontinued     1 g 200 mL/hr over 30 Minutes Intravenous  Once 10/12/19 0121 10/12/19 0125   10/12/19 0130  metroNIDAZOLE (  FLAGYL) IVPB 500 mg  Status:  Discontinued     500 mg 100 mL/hr over 60 Minutes Intravenous  Once 10/12/19 0121 10/12/19 0126   10/12/19 0130  ceFEPIme (MAXIPIME) 2 g in sodium chloride 0.9 % 100 mL IVPB     2 g 200 mL/hr over 30 Minutes Intravenous Every 12 hours 10/12/19 0124     10/12/19 0130  vancomycin (VANCOCIN) IVPB 1000 mg/200  mL premix  Status:  Discontinued     1,000 mg 200 mL/hr over 60 Minutes Intravenous  Once 10/12/19 0124 10/12/19 0126   10/12/19 0130  vancomycin (VANCOREADY) IVPB 1250 mg/250 mL     1,250 mg 166.7 mL/hr over 90 Minutes Intravenous Every 24 hours 10/12/19 0129        Subjective:  Patient seen and examined the bedside this morning.  Hemodynamically stable.  Was very drowsy/sleepy because he was given Ativan last night.  Objective: Vitals:   10/13/19 2018 10/14/19 0000 10/14/19 0520 10/14/19 0752  BP: (!) 141/89 (!) 136/94 114/67   Pulse: 89 74 73 79  Resp: 20 20 20 20   Temp: 99.1 F (37.3 C) 99.1 F (37.3 C) 99.9 F (37.7 C) 98.3 F (36.8 C)  TempSrc: Oral Oral Axillary Axillary  SpO2: 94% 91% 92% 93%  Weight:      Height:        Intake/Output Summary (Last 24 hours) at 10/14/2019 0756 Last data filed at 10/13/2019 1823 Gross per 24 hour  Intake 360 ml  Output 650 ml  Net -290 ml   Filed Weights   10/11/19 2304  Weight: 88.5 kg    Examination:  General exam: Sleepy/drowsy Respiratory system:  no wheezes or crackles  Cardiovascular system: Irregularly irregular rhythm  gastrointestinal system: Abdomen is nondistended, soft and nontender.  Central nervous system: Sleeping Extremities: No edema, no clubbing ,no cyanosis Skin: No rashes, lesions or ulcers,no icterus ,no pallor    Data Reviewed: I have personally reviewed following labs and imaging studies  CBC: Recent Labs  Lab 10/11/19 2325 10/12/19 0340 10/13/19 0436  WBC 21.9* 19.8* 11.8*  NEUTROABS 19.8* 17.3* 9.5*  HGB 14.2 13.0 13.4  HCT 42.8 39.8 40.0  MCV 90.9 92.1 88.9  PLT 221 179 0000000   Basic Metabolic Panel: Recent Labs  Lab 10/11/19 2325 10/12/19 0340 10/12/19 0722 10/13/19 0436 10/13/19 1425 10/14/19 0307  NA 140 141  --  138  --  137  K 4.2 4.3  --  3.4*  --  3.6  CL 103 106  --  105  --  101  CO2 21* 22  --  20*  --  21*  GLUCOSE 153* 134*  --  119*  --  123*  BUN 19 22  --  23   --  23  CREATININE 1.45* 1.34*  --  1.13  --  1.11  CALCIUM 9.6 9.1  --  8.9  --  8.8*  MG  --   --  1.6* 1.7 1.7  --    GFR: Estimated Creatinine Clearance: 54 mL/min (by C-G formula based on SCr of 1.11 mg/dL). Liver Function Tests: Recent Labs  Lab 10/11/19 2325 10/12/19 0340  AST 20 16  ALT 15 15  ALKPHOS 72 61  BILITOT 1.5* 1.8*  PROT 6.6 5.8*  ALBUMIN 4.0 3.4*   No results for input(s): LIPASE, AMYLASE in the last 168 hours. No results for input(s): AMMONIA in the last 168 hours. Coagulation Profile: Recent Labs  Lab 10/12/19 0140  INR  2.0*   Cardiac Enzymes: No results for input(s): CKTOTAL, CKMB, CKMBINDEX, TROPONINI in the last 168 hours. BNP (last 3 results) No results for input(s): PROBNP in the last 8760 hours. HbA1C: Recent Labs    10/12/19 0722  HGBA1C 6.5*   CBG: Recent Labs  Lab 10/13/19 1202 10/13/19 1625 10/13/19 2019 10/14/19 0029 10/14/19 0508  GLUCAP 109* 128* 211* 116* 146*   Lipid Profile: No results for input(s): CHOL, HDL, LDLCALC, TRIG, CHOLHDL, LDLDIRECT in the last 72 hours. Thyroid Function Tests: No results for input(s): TSH, T4TOTAL, FREET4, T3FREE, THYROIDAB in the last 72 hours. Anemia Panel: No results for input(s): VITAMINB12, FOLATE, FERRITIN, TIBC, IRON, RETICCTPCT in the last 72 hours. Sepsis Labs: Recent Labs  Lab 10/11/19 2315 10/12/19 0140 10/12/19 0722 10/13/19 0436  LATICACIDVEN 3.6* 2.2* 2.0* 1.3    Recent Results (from the past 240 hour(s))  Blood culture (routine x 2)     Status: None (Preliminary result)   Collection Time: 10/11/19 11:20 PM   Specimen: BLOOD LEFT WRIST  Result Value Ref Range Status   Specimen Description BLOOD LEFT WRIST  Final   Special Requests   Final    BOTTLES DRAWN AEROBIC AND ANAEROBIC Blood Culture adequate volume   Culture   Final    NO GROWTH 1 DAY Performed at [facility], [place] [address] [place] [address], [place], [place] [address]    Report Status PENDING  Incomplete  Blood  culture (routine x 2)     Status: None (Preliminary result)   Collection Time: 10/11/19 11:25 PM   Specimen: BLOOD  Result Value Ref Range Status   Specimen Description BLOOD LEFT ANTECUBITAL  Final   Special Requests   Final    BOTTLES DRAWN AEROBIC AND ANAEROBIC Blood Culture adequate volume   Culture   Final    NO GROWTH 1 DAY Performed at [facility], [place] [address] [place], [place] [address]    Report Status PENDING  Incomplete  SARS CORONAVIRUS 2 (TAT 6-24 HRS) Nasopharyngeal Nasopharyngeal Swab     Status: None   Collection Time: 10/12/19  1:12 AM   Specimen: Nasopharyngeal Swab  Result Value Ref Range Status   SARS Coronavirus 2 NEGATIVE NEGATIVE Final    Comment: (NOTE) SARS-CoV-2 target nucleic acids are NOT DETECTED. The SARS-CoV-2 RNA is generally detectable in upper and lower respiratory specimens during the acute phase of infection. Negative results do not preclude SARS-CoV-2 infection, do not rule out co-infections with other pathogens, and should not be used as the sole basis for treatment or other patient management decisions. Negative results must be combined with clinical observations, patient history, and epidemiological information. The expected result is Negative. Fact Sheet for Patients: SugarRoll.be Fact Sheet for Healthcare Providers: https://www.woods-mathews.com/ This test is not yet approved or cleared by the [place] FDA and  has been authorized for detection and/or diagnosis of SARS-CoV-2 by FDA under an Emergency Use Authorization (EUA). This EUA will remain  in effect (meaning this test can be used) for the duration of the COVID-19 declaration under Section 56 4(b)(1) of the Act, 21 U.S.C. section 360bbb-3(b)(1), unless the authorization is terminated or revoked sooner. Performed at [facility], [place] [address] [place], [place] [address]          Radiology Studies: No results  found.      Scheduled Meds: . apixaban  5 mg Oral BID  . Chlorhexidine Gluconate Cloth  6 each Topical Daily  . famotidine  20  mg Oral Daily  . insulin aspart  0-9 Units Subcutaneous Q4H  . sodium chloride flush  3 mL Intravenous Q12H  . sodium chloride flush  3 mL Intravenous Q12H  . tamsulosin  0.4 mg Oral Daily   Continuous Infusions: . sodium chloride    . ceFEPime (MAXIPIME) IV 2 g (10/14/19 0001)  . metronidazole 500 mg (10/14/19 0504)  . vancomycin 1,250 mg (10/14/19 0207)     LOS: 2 days    Time spent: 35 mins.More than 50% of that time was spent in counseling and/or coordination of care.      Shelly Coss, MD [facility] P3/29/2021, 7:56 AM

## 2019-10-14 NOTE — TOC Initial Note (Signed)
Transition of Care [facility]) - Initial/Assessment Note    Patient Details  Name: Derrick Ross MRN: [id number] Date of Birth: Oct 01, 1930  Transition of Care [facility]) CM/SW Contact:    Vinie Sill, [place] Phone Number: 10/14/2019, 4:27 PM  Clinical Narrative:                  CSW spoke with the patient's daughter,[name]. CSW introduced self and explained role. CSW discussed PT recommendation of at rehab at [facility]. Patient's daughter explained patient's wife unable to provide the level of care needed as this time. She ws recently discharged from SNF this past Thursday. Patient daughter works during the day. Family agree with discharge plan of ST rehab at [facility]. CSW explained the SNF process. The [facility] preference [facility]. CSW contacted [facility] and they confirmed bed offer.   CSW started insurance authorization. Patient will need insurance authorization before discharged to SNF and no mittens/restriants  for 24 hrs.  Thurmond Butts, MSW, [place] Clinical Social Worker     Expected Discharge Plan: Skilled Nursing Facility Barriers to Discharge: Ship broker, Requiring sitter/restraints   Patient Goals and CMS Choice        Expected Discharge Plan and Services Expected Discharge Plan: [place] In-house Referral: Clinical Social Work     Living arrangements for the past 2 months: [place]                                      Prior Living Arrangements/Services Living arrangements for the past 2 months: Single Family Home Lives with:: Self, Spouse Patient language and need for interpreter reviewed:: No        Need for Family Participation in Patient Care: Yes (Comment) Care giver support system in place?: Yes (comment)   Criminal Activity/Legal Involvement Pertinent to Current Situation/Hospitalization: No - Comment as needed  Activities of Daily Living      Permission Sought/Granted Permission sought to share information  with : Family Supports, Customer service manager, Case Manager Permission granted to share information with : Yes, Verbal Permission Granted  Share Information with NAME: Kimm Gardner  Permission granted to share info w AGENCY: SNFs  Permission granted to share info w Relationship: daughter  Permission granted to share info w Contact Information: [phone]  Emotional Assessment       Orientation: : Oriented to Self Alcohol / Substance Use: Not Applicable Psych Involvement: No (comment)  Admission diagnosis:  Fall, initial encounter [W19.XXXA] Sepsis ([place]) [A41.9] Sepsis, due to unspecified organism, unspecified whether acute organ dysfunction present [facility]) [A41.9] Patient Active Problem List   Diagnosis Date Noted  . Sepsis ([place]) 10/12/2019  . History of hypertension 10/12/2019  . History of ischemic stroke 10/12/2019  . Renal insufficiency 10/12/2019  . DM (diabetes mellitus) ([place])   . Dementia ()   . Osteoarthritis of knee 08/25/2017  . TIA (transient ischemic attack) 09/26/2014  . CVA (cerebral infarction) 09/26/2014  . Confusion 09/26/2014  . Acute encephalopathy 09/26/2014  . HTN (hypertension) 09/26/2014  . Dyslipidemia 09/26/2014  . Atrial fibrillation ([place]) 09/26/2014   PCP:  Wenda Low, MD Pharmacy:   [facility] ([place]) [place], [place] [address] [place] [address] Phone: [phone] Fax: [phone]  [facility] ([place]), [place] - [address] [place] [address] Phone: [phone] Fax: [phone]  Social Determinants of Health (SDOH) Interventions    Readmission Risk Interventions No flowsheet data found.

## 2019-10-14 NOTE — Progress Notes (Signed)
The patient was received from [place] via bed.  On arrival the patient appears very drowsy but will open eyes to verbal and tactile stimuli.  The nurse reported that the patient was given Ativan during the last shift and has been drowsy since,  [facility] is elevated for comfort.  VS WNL  Condom cath noted with dark yellow urine.  Testicles appear enlarged/ swollen.

## 2019-10-15 LAB — GLUCOSE, CAPILLARY
Glucose-Capillary: 121 mg/dL — ABNORMAL HIGH (ref 70–99)
Glucose-Capillary: 129 mg/dL — ABNORMAL HIGH (ref 70–99)
Glucose-Capillary: 134 mg/dL — ABNORMAL HIGH (ref 70–99)
Glucose-Capillary: 137 mg/dL — ABNORMAL HIGH (ref 70–99)
Glucose-Capillary: 151 mg/dL — ABNORMAL HIGH (ref 70–99)
Glucose-Capillary: 174 mg/dL — ABNORMAL HIGH (ref 70–99)

## 2019-10-15 LAB — SARS CORONAVIRUS 2 (TAT 6-24 HRS): SARS Coronavirus 2: NEGATIVE

## 2019-10-15 MED ORDER — HALOPERIDOL LACTATE 5 MG/ML IJ SOLN: 2.0000 mg | Freq: Four times a day (QID) | INTRAMUSCULAR | Status: DC | PRN

## 2019-10-15 NOTE — Progress Notes (Signed)
PROGRESS NOTE    Derrick Ross  [id number] DOB: [birth year] DOA: 10/11/2019 PCP: Wenda Low, MD   Brief Narrative: Patient is a 86 year old male with history of CVA, dementia, hypertension, diabetes type 2, A. fib on Eliquis who presents to the emergency department with complaints of generalized weakness, frequent urination.  Patient was found to be more weak in general for last few days.  On presentation, he was found to be febrile, hypotensive.  Chest x-ray did not show any pneumonia.  Found to have leukocytosis, lactic acidosis.  Sepsis suspected and started on broad spectrum antibiotics.  He remains hemodynamically stable.  Antibiotics has been changed to oral.  PT/OT recommended skilled nursing facility.  Patient is hemodynamically stable for discharge to skilled nursing facility as soon as bed is available.  Assessment & Plan:   Principal Problem:   Sepsis ([place]) Active Problems:   Atrial fibrillation ([place])   History of hypertension   History of ischemic stroke   DM (diabetes mellitus) ([place])   Dementia ([place])   Renal insufficiency   Sepsis: Presented with generalized weakness, fever, hypotension, leukocytosis, lactic acidosis, AKI.  Chest x-ray did not show pneumonia.  Urinalysis is not impressive for UTI.  No abdominal tenderness.  Cultures have been sent,NGTD.  Started on broad start antibiotics ,now descalated  to oral antibiotics Blood pressure is better now.  Leukocytosis has improved.  Patient is afebrile currently  Permanent A. fib: Currently rate is well controlled. On Eliquis.    AKI vs CkD stage 2: No recent labs to compare.  Started on gentle IV fluids with improvement in the kidney function.  IV fluids discontinued.  Diabetes type 2: On oral agents at home.  Continue sliding scale  insulin for now.  Monitor CBGs.  Hemoglobin A1c of 6.5  Hypertension: Takes multiple medications at home.  Blood pressure was soft on presentation.  Antihypertensives on  hold.  Dementia: Pretty advanced.  Confused at baseline.  Continue supportive care.  He has history of CVA.  Large left inguinal hernia: Likely incarcerated.Chronic. Has been there for several years. Follow-up with general surgery as an outpatient.  Hypokalemia/Hypomagnesemia: Supplemented   Urinary retention:resolved.Started on flomax.  Debilty/deconditioning: Walks with cane/walker. PT/OT evaluation, recommend skilled nursing facility        DVT prophylaxis:Eliquis Code Status: Full Family Communication: Called daughter on 10/14/19 for update on phone Disposition Plan: Patient is from home.  PT/OT recommending skilled facility.  He is hemodynamically stable for discharge to skilled nursing facility soon as bed is available.    Consultants: None  Procedures:None  Antimicrobials:  Anti-infectives (From admission, onward)   Start     Dose/Rate Route Frequency Ordered Stop   10/14/19 1200  amoxicillin-clavulanate (AUGMENTIN) 875-125 MG per tablet 1 tablet     1 tablet Oral Every 12 hours 10/14/19 1124 10/19/19 0959   10/12/19 0200  metroNIDAZOLE (FLAGYL) IVPB 500 mg  Status:  Discontinued     500 mg 100 mL/hr over 60 Minutes Intravenous Every 8 hours 10/12/19 0124 10/12/19 0126   10/12/19 0200  metroNIDAZOLE (FLAGYL) IVPB 500 mg  Status:  Discontinued     500 mg 100 mL/hr over 60 Minutes Intravenous Every 8 hours 10/12/19 0127 10/14/19 1124   10/12/19 0130  vancomycin (VANCOCIN) IVPB 1000 mg/200 mL premix  Status:  Discontinued     1,000 mg 200 mL/hr over 60 Minutes Intravenous  Once 10/12/19 0121 10/12/19 0129   10/12/19 0130  ceFEPIme (MAXIPIME) 1 g in sodium chloride 0.9 %  100 mL IVPB  Status:  Discontinued     1 g 200 mL/hr over 30 Minutes Intravenous  Once 10/12/19 0121 10/12/19 0125   10/12/19 0130  metroNIDAZOLE (FLAGYL) IVPB 500 mg  Status:  Discontinued     500 mg 100 mL/hr over 60 Minutes Intravenous  Once 10/12/19 0121 10/12/19 0126   10/12/19 0130  ceFEPIme  (MAXIPIME) 2 g in sodium chloride 0.9 % 100 mL IVPB  Status:  Discontinued     2 g 200 mL/hr over 30 Minutes Intravenous Every 12 hours 10/12/19 0124 10/14/19 1124   10/12/19 0130  vancomycin (VANCOCIN) IVPB 1000 mg/200 mL premix  Status:  Discontinued     1,000 mg 200 mL/hr over 60 Minutes Intravenous  Once 10/12/19 0124 10/12/19 0126   10/12/19 0130  vancomycin (VANCOREADY) IVPB 1250 mg/250 mL  Status:  Discontinued     1,250 mg 166.7 mL/hr over 90 Minutes Intravenous Every 24 hours 10/12/19 0129 10/14/19 1124      Subjective: Patient seen and examined the bedside this morning.  Hemodynamically stable.  Comfortable with no acute issues.  Can be discharged to skilled facility whenever bed is available.  Objective: Vitals:   10/14/19 1635 10/14/19 1819 10/14/19 1936 10/15/19 0300  BP: (!) 104/56  131/79 137/71  Pulse: 75 86 80 72  Resp: (!) 24  20 15   Temp: 99.2 F (37.3 C)  99.6 F (37.6 C) 98.6 F (37 C)  TempSrc: Oral  Oral Oral  SpO2:  98% 94% 95%  Weight:      Height:        Intake/Output Summary (Last 24 hours) at 10/15/2019 0757 Last data filed at 10/15/2019 0100 Gross per 24 hour  Intake 1589.73 ml  Output 925 ml  Net 664.73 ml   Filed Weights   10/11/19 2304  Weight: 88.5 kg    Examination:  General exam:Comfortable Respiratory system: no wheezes or crackles  Cardiovascular system: Irregularly irregular rhythm Gastrointestinal system: Abdomen is nondistended, soft and nontender.  Central nervous system: Sleeping.confused at baseline  extremities: No edema, no clubbing ,no cyanosis Skin: No rashes, lesions or ulcers,no icterus ,no pallor     Data Reviewed: I have personally reviewed following labs and imaging studies  CBC: Recent Labs  Lab 10/11/19 2325 10/12/19 0340 10/13/19 0436  WBC 21.9* 19.8* 11.8*  NEUTROABS 19.8* 17.3* 9.5*  HGB 14.2 13.0 13.4  HCT 42.8 39.8 40.0  MCV 90.9 92.1 88.9  PLT 221 179 0000000   Basic Metabolic Panel: Recent  Labs  Lab 10/11/19 2325 10/12/19 0340 10/12/19 0722 10/13/19 0436 10/13/19 1425 10/14/19 0307  NA 140 141  --  138  --  137  K 4.2 4.3  --  3.4*  --  3.6  CL 103 106  --  105  --  101  CO2 21* 22  --  20*  --  21*  GLUCOSE 153* 134*  --  119*  --  123*  BUN 19 22  --  23  --  23  CREATININE 1.45* 1.34*  --  1.13  --  1.11  CALCIUM 9.6 9.1  --  8.9  --  8.8*  MG  --   --  1.6* 1.7 1.7  --    GFR: Estimated Creatinine Clearance: 54 mL/min (by C-G formula based on SCr of 1.11 mg/dL). Liver Function Tests: Recent Labs  Lab 10/11/19 2325 10/12/19 0340  AST 20 16  ALT 15 15  ALKPHOS 72 61  BILITOT 1.5*  1.8*  PROT 6.6 5.8*  ALBUMIN 4.0 3.4*   No results for input(s): LIPASE, AMYLASE in the last 168 hours. No results for input(s): AMMONIA in the last 168 hours. Coagulation Profile: Recent Labs  Lab 10/12/19 0140  INR 2.0*   Cardiac Enzymes: No results for input(s): CKTOTAL, CKMB, CKMBINDEX, TROPONINI in the last 168 hours. BNP (last 3 results) No results for input(s): PROBNP in the last 8760 hours. HbA1C: No results for input(s): HGBA1C in the last 72 hours. CBG: Recent Labs  Lab 10/14/19 1111 10/14/19 1531 10/14/19 1937 10/15/19 0010 10/15/19 0446  GLUCAP 114* 135* 235* 134* 129*   Lipid Profile: No results for input(s): CHOL, HDL, LDLCALC, TRIG, CHOLHDL, LDLDIRECT in the last 72 hours. Thyroid Function Tests: No results for input(s): TSH, T4TOTAL, FREET4, T3FREE, THYROIDAB in the last 72 hours. Anemia Panel: No results for input(s): VITAMINB12, FOLATE, FERRITIN, TIBC, IRON, RETICCTPCT in the last 72 hours. Sepsis Labs: Recent Labs  Lab 10/11/19 2315 10/12/19 0140 10/12/19 0722 10/13/19 0436  LATICACIDVEN 3.6* 2.2* 2.0* 1.3    Recent Results (from the past 240 hour(s))  Blood culture (routine x 2)     Status: None (Preliminary result)   Collection Time: 10/11/19 11:20 PM   Specimen: BLOOD LEFT WRIST  Result Value Ref Range Status   Specimen  Description BLOOD LEFT WRIST  Final   Special Requests   Final    BOTTLES DRAWN AEROBIC AND ANAEROBIC Blood Culture adequate volume Performed at [facility], [place] [address], [place], [place] [address]    Culture NO GROWTH 2 DAYS  Final   Report Status PENDING  Incomplete  Blood culture (routine x 2)     Status: None (Preliminary result)   Collection Time: 10/11/19 11:25 PM   Specimen: BLOOD  Result Value Ref Range Status   Specimen Description BLOOD LEFT ANTECUBITAL  Final   Special Requests   Final    BOTTLES DRAWN AEROBIC AND ANAEROBIC Blood Culture adequate volume Performed at [facility], [place] [address] [place], [place] [address]    Culture NO GROWTH 2 DAYS  Final   Report Status PENDING  Incomplete  SARS CORONAVIRUS 2 (TAT 6-24 HRS) Nasopharyngeal Nasopharyngeal Swab     Status: None   Collection Time: 10/12/19  1:12 AM   Specimen: Nasopharyngeal Swab  Result Value Ref Range Status   SARS Coronavirus 2 NEGATIVE NEGATIVE Final    Comment: (NOTE) SARS-CoV-2 target nucleic acids are NOT DETECTED. The SARS-CoV-2 RNA is generally detectable in upper and lower respiratory specimens during the acute phase of infection. Negative results do not preclude SARS-CoV-2 infection, do not rule out co-infections with other pathogens, and should not be used as the sole basis for treatment or other patient management decisions. Negative results must be combined with clinical observations, patient history, and epidemiological information. The expected result is Negative. Fact Sheet for Patients: SugarRoll.be Fact Sheet for Healthcare Providers: https://www.woods-mathews.com/ This test is not yet approved or cleared by the [place] FDA and  has been authorized for detection and/or diagnosis of SARS-CoV-2 by FDA under an Emergency Use Authorization (EUA). This EUA will remain  in effect (meaning this test can be used) for the duration  of the COVID-19 declaration under Section 56 4(b)(1) of the Act, 21 U.S.C. section 360bbb-3(b)(1), unless the authorization is terminated or revoked sooner. Performed at [facility], [place] [address] [place], [place] [address]          Radiology Studies:  No results found.      Scheduled Meds: . amoxicillin-clavulanate  1 tablet Oral Q12H  . apixaban  5 mg Oral BID  . Chlorhexidine Gluconate Cloth  6 each Topical Daily  . famotidine  20 mg Oral Daily  . insulin aspart  0-9 Units Subcutaneous Q4H  . potassium chloride  20 mEq Oral Daily  . sodium chloride flush  3 mL Intravenous Q12H  . sodium chloride flush  3 mL Intravenous Q12H  . tamsulosin  0.4 mg Oral Daily   Continuous Infusions: . sodium chloride       LOS: 3 days    Time spent: 35 mins.More than 50% of that time was spent in counseling and/or coordination of care.      Shelly Coss, MD [facility] P3/30/2021, 7:57 AM

## 2019-10-15 NOTE — Progress Notes (Signed)
NCM spoke with [facility], [phone]. Per  [employer] authorization for SNF placement is still under  under review. TOC will continue to monitor and follow. Whitman Hero RN,BSN,CM [phone]

## 2019-10-15 NOTE — Plan of Care (Signed)
  Problem: Nutrition: Goal: Adequate nutrition will be maintained Outcome: Progressing Note: Ate really well today at lunch and dinner, only ate a little breaksfast   Problem: Coping: Goal: Level of anxiety will decrease Outcome: Progressing   Problem: Elimination: Goal: Will not experience complications related to urinary retention Outcome: Progressing   Problem: Pain Managment: Goal: General experience of comfort will improve Outcome: Progressing Note: No complaints of pain today   Problem: Safety: Goal: Ability to remain free from injury will improve Outcome: Progressing

## 2019-10-15 NOTE — TOC Progression Note (Addendum)
Transition of Care [facility]) - Progression Note    Patient Details  Name: SNEHAL BINKS MRN: [id number] Date of Birth: Dec 13, 1930  [facility] CM/SW Contact  Sharin Mons, RN Phone Number: 10/15/2019, 11:28 AM  Clinical Narrative:     Awaiting insurance authorization ... patient to transition to [facility] once reecived. TOC team following for needs...    10/15/2019 @1300  Insurance authorization received : [id number]. NCM made [facility] aware. [facility] stated they can receive pt after pt has been 24 hrs free of mittens, tomorrow 10/16/2019. MD and bedside nurse made aware. Per Ritta Slot  updated COVID not needed 2/2 pt being vaccinated and displaying no symptoms.  Expected Discharge Plan: Skilled Nursing Facility Barriers to Discharge: Insurance Authorization  Expected Discharge Plan and Services Expected Discharge Plan: [place] In-house Referral: Clinical Social Work     Living arrangements for the past 2 months: Single Family Home                    Social Determinants of Health (SDOH) Interventions    Readmission Risk Interventions No flowsheet data found.

## 2019-10-15 NOTE — Progress Notes (Signed)
Not a rapid response event.  I was asked for a 2nd opinion regarding [name] assessment. Pt drowsy, picking at medical devices and able to answer some questions. He denied SOB and denies pain. VSS.  Please call if any other concerns arise.

## 2019-10-15 NOTE — Progress Notes (Addendum)
Physical Therapy Treatment Patient Details Name: Derrick Ross MRN: [id number] DOB: 24-Jan-1931 Today's Date: 10/15/2019    History of Present Illness 86 y.o. male with medical history significant for history of CVA, dementia, hypertension, type 2 diabetes mellitus, and atrial fibrillation on anticoagulation, who presented to the emergency department with generalized weakness and frequent urination. Pt diagnosed with fever, sepsis and A-fib    PT Comments    Pt with eyes closed on arrival would open eyes to name, stated name and place appropriately. Pt with lack of progression from last session and continued decline in physical mobility with pt not assisting with transfers even to EOB but was able to engage core for sitting balance. Pt with significant change from PLOF per nursing report from family and will continue to follow to maximize strength, cognition and function. Pt with sats 93% on RA initially with drop to 85% end of session and return to 95% on 2L.     Follow Up Recommendations  SNF;Supervision/Assistance - 24 hour     Equipment Recommendations  Hospital bed    Recommendations for Other Services       Precautions / Restrictions Precautions Precautions: Fall Precaution Comments: scrotal edema with hernia    Mobility  Bed Mobility Overal bed mobility: Needs Assistance Bed Mobility: Supine to Sit;Sit to Supine;Rolling Rolling: Max assist   Supine to sit: Max assist;HOB elevated;+2 for safety/equipment Sit to supine: Max assist;+2 for physical assistance   General bed mobility comments: max +2 assist to pivot to EOB with HOB 30 degrees with very little effort from pt. Return to bed pt looking to pillow and providing some control of trunk for descent to surface. Total +2 assist to slide toward HOB.  Transfers                 General transfer comment: unable to attempt due to lethargy, lack of interaction and AMS  Ambulation/Gait                  Stairs             Wheelchair Mobility    Modified Rankin (Stroke Patients Only)       Balance Overall balance assessment: Needs assistance Sitting-balance support: Feet supported;No upper extremity supported Sitting balance-Leahy Scale: Fair Sitting balance - Comments: pt with initial sitting EOB required mod assist to scoot hips to edge and obtain balance then pt able to maintain 8 min with guarding and cues for looking up. Maintained flexed trunk and neck                                    Cognition Arousal/Alertness: Lethargic Behavior During Therapy: Flat affect Overall Cognitive Status: No family/caregiver present to determine baseline cognitive functioning Area of Impairment: Orientation;Memory;Following commands;Safety/judgement;Awareness;Problem solving                 Orientation Level: Disoriented to;Time;Situation Current Attention Level: Focused Memory: Decreased short-term memory Following Commands: Follows one step commands inconsistently Safety/Judgement: Decreased awareness of safety;Decreased awareness of deficits   Problem Solving: Slow processing;Requires verbal cues;Requires tactile cues;Difficulty sequencing General Comments: pt with clear speech able to state name and place. He stated he was trespassing as regard to situation. Pt with eyes closed throughout majority of session but would open for grossly 30 sec at a time with cueing. Pt able to follows cues to open eyes with delay and wipe his face  only      Exercises General Exercises - Lower Extremity Long Arc Quad: PROM;Both;Seated;10 reps Hip Flexion/Marching: PROM;Both;Seated;10 reps    General Comments        Pertinent Vitals/Pain Pain Assessment: Faces Faces Pain Scale: Hurts little more Pain Location: pt reports bil foot pain with transition to sitting, unable to further describe Pain Intervention(s): Limited activity within patient's tolerance;Monitored  during session;Repositioned    Home Living                      Prior Function            PT Goals (current goals can now be found in the care plan section) Progress towards PT goals: Not progressing toward goals - comment;Goals downgraded-see care plan    Frequency    Min 2X/week      PT Plan Current plan remains appropriate    Co-evaluation              AM-PAC PT "6 Clicks" Mobility   Outcome Measure  Help needed turning from your back to your side while in a flat bed without using bedrails?: Total Help needed moving from lying on your back to sitting on the side of a flat bed without using bedrails?: Total Help needed moving to and from a bed to a chair (including a wheelchair)?: Total Help needed standing up from a chair using your arms (e.g., wheelchair or bedside chair)?: Total Help needed to walk in hospital room?: Total Help needed climbing 3-5 steps with a railing? : Total 6 Click Score: 6    End of Session   Activity Tolerance: Patient limited by lethargy Patient left: in bed;with call bell/phone within reach;with bed alarm set Nurse Communication: Mobility status;Need for lift equipment PT Visit Diagnosis: Other abnormalities of gait and mobility (R26.89);Muscle weakness (generalized) (M62.81)     Time: [id number] PT Time Calculation (min) (ACUTE ONLY): 23 min  Charges:  $Therapeutic Exercise: 8-22 mins $Therapeutic Activity: 8-22 mins                     Momoko Slezak P, PT Acute Rehabilitation Services Pager: [phone] Office: [place] 10/15/2019, 1:42 PM

## 2019-10-15 NOTE — NC FL2 (Signed)
[place] MEDICAID FL2 LEVEL OF CARE SCREENING TOOL     IDENTIFICATION  Patient Name: Derrick Ross Birthdate: 02/06/1931 Sex: male Admission Date (Current Location): 10/11/2019  [facility] and [place] Number:  Herbalist and Address:  [facility] [place], [place] [address] [place], [place] [address]      Provider Number: [id number]  Attending Physician Name and Address:  Shelly Coss, MD  Relative Name and Phone Number:  Caryn Bee ([phone]    Current Level of Care: Hospital Recommended Level of Care: [place] Prior Approval Number:    Date Approved/Denied:   PASRR Number: [id number]  Discharge Plan: SNF    Current Diagnoses: Patient Active Problem List   Diagnosis Date Noted  . Sepsis ([place]) 10/12/2019  . History of hypertension 10/12/2019  . History of ischemic stroke 10/12/2019  . Renal insufficiency 10/12/2019  . DM (diabetes mellitus) ([place])   . Dementia ([place])   . Osteoarthritis of knee 08/25/2017  . TIA (transient ischemic attack) 09/26/2014  . CVA (cerebral infarction) 09/26/2014  . Confusion 09/26/2014  . Acute encephalopathy 09/26/2014  . HTN (hypertension) 09/26/2014  . Dyslipidemia 09/26/2014  . Atrial fibrillation ([place]) 09/26/2014    Orientation RESPIRATION BLADDER Height & Weight     Self  Normal External catheter Weight: 88.5 kg Height:  6\' 1"  (185.4 cm)  BEHAVIORAL SYMPTOMS/MOOD NEUROLOGICAL BOWEL NUTRITION STATUS      Continent Diet(Refer to d/c summary)  AMBULATORY STATUS COMMUNICATION OF NEEDS Skin   Extensive Assist Verbally Normal                       Personal Care Assistance Level of Assistance  Bathing, Dressing, Feeding Bathing Assistance: Maximum assistance Feeding assistance: Limited assistance Dressing Assistance: Maximum assistance     Functional Limitations Info  Sight, Hearing, Speech Sight Info: Adequate Hearing Info: Adequate Speech Info: Adequate     SPECIAL CARE FACTORS FREQUENCY  PT (By licensed PT), OT (By licensed OT)     PT Frequency: 5x/ week, evaluate and treat OT Frequency: 5x/ week, evaluate and treat            Contractures Contractures Info: Not present    Additional Factors Info  Code Status, Allergies Code Status Info: full code Allergies Info: No Known Allergies           Current Medications (10/15/2019):  This is the current hospital active medication list Current Facility-Administered Medications  Medication Dose Route Frequency Provider Last Rate Last Admin  . 0.9 %  sodium chloride infusion  250 mL Intravenous PRN Opyd, Ilene Qua, MD      . acetaminophen (TYLENOL) tablet 650 mg  650 mg Oral Q6H PRN Opyd, Ilene Qua, MD       Or  . acetaminophen (TYLENOL) suppository 650 mg  650 mg Rectal Q6H PRN Opyd, Ilene Qua, MD      . amoxicillin-clavulanate (AUGMENTIN) 875-125 MG per tablet 1 tablet  1 tablet Oral Q12H Shelly Coss, MD   1 tablet at 10/15/19 1056  . apixaban (ELIQUIS) tablet 5 mg  5 mg Oral BID Shelly Coss, MD   5 mg at 10/15/19 0846  . famotidine (PEPCID) tablet 20 mg  20 mg Oral Daily Adhikari, Amrit, MD   20 mg at 10/15/19 1056  . haloperidol lactate (HALDOL) injection 2 mg  2 mg Intravenous Q6H PRN Adhikari, Amrit, MD      . insulin aspart (novoLOG) injection 0-9 Units  0-9  Units Subcutaneous Q4H Vianne Bulls, MD   1 Units at 10/15/19 [phone]  . ondansetron (ZOFRAN) tablet 4 mg  4 mg Oral Q6H PRN Opyd, Ilene Qua, MD       Or  . ondansetron (ZOFRAN) injection 4 mg  4 mg Intravenous Q6H PRN Opyd, Ilene Qua, MD      . potassium chloride SA (KLOR-CON) CR tablet 20 mEq  20 mEq Oral Daily Shelly Coss, MD   20 mEq at 10/15/19 1056  . sodium chloride flush (NS) 0.9 % injection 3 mL  3 mL Intravenous Q12H Opyd, Ilene Qua, MD   3 mL at 10/14/19 2225  . sodium chloride flush (NS) 0.9 % injection 3 mL  3 mL Intravenous Q12H Opyd, Ilene Qua, MD   3 mL at 10/15/19 1100  . sodium chloride flush  (NS) 0.9 % injection 3 mL  3 mL Intravenous PRN Opyd, Ilene Qua, MD      . tamsulosin (FLOMAX) capsule 0.4 mg  0.4 mg Oral Daily Shelly Coss, MD   0.4 mg at 10/15/19 1056     Discharge Medications: Please see discharge summary for a list of discharge medications.  Relevant Imaging Results:  Relevant Lab Results:   Additional Information ss# [id number]  Sharin Mons, RN

## 2019-10-16 DIAGNOSIS — Z8673 Personal history of transient ischemic attack (TIA), and cerebral infarction without residual deficits: Secondary | ICD-10-CM | POA: Diagnosis not present

## 2019-10-16 DIAGNOSIS — Z20828 Contact with and (suspected) exposure to other viral communicable diseases: Secondary | ICD-10-CM | POA: Diagnosis not present

## 2019-10-16 DIAGNOSIS — G309 Alzheimer's disease, unspecified: Secondary | ICD-10-CM | POA: Diagnosis not present

## 2019-10-16 DIAGNOSIS — E1169 Type 2 diabetes mellitus with other specified complication: Secondary | ICD-10-CM | POA: Diagnosis not present

## 2019-10-16 DIAGNOSIS — K4030 Unilateral inguinal hernia, with obstruction, without gangrene, not specified as recurrent: Secondary | ICD-10-CM | POA: Diagnosis not present

## 2019-10-16 DIAGNOSIS — E785 Hyperlipidemia, unspecified: Secondary | ICD-10-CM | POA: Diagnosis not present

## 2019-10-16 DIAGNOSIS — R2689 Other abnormalities of gait and mobility: Secondary | ICD-10-CM | POA: Diagnosis not present

## 2019-10-16 DIAGNOSIS — R6520 Severe sepsis without septic shock: Secondary | ICD-10-CM | POA: Diagnosis not present

## 2019-10-16 DIAGNOSIS — N183 Chronic kidney disease, stage 3 unspecified: Secondary | ICD-10-CM | POA: Diagnosis not present

## 2019-10-16 DIAGNOSIS — N390 Urinary tract infection, site not specified: Secondary | ICD-10-CM | POA: Diagnosis not present

## 2019-10-16 DIAGNOSIS — I4891 Unspecified atrial fibrillation: Secondary | ICD-10-CM | POA: Diagnosis not present

## 2019-10-16 DIAGNOSIS — A419 Sepsis, unspecified organism: Secondary | ICD-10-CM | POA: Diagnosis not present

## 2019-10-16 DIAGNOSIS — N400 Enlarged prostate without lower urinary tract symptoms: Secondary | ICD-10-CM | POA: Diagnosis not present

## 2019-10-16 DIAGNOSIS — R41841 Cognitive communication deficit: Secondary | ICD-10-CM | POA: Diagnosis not present

## 2019-10-16 DIAGNOSIS — I10 Essential (primary) hypertension: Secondary | ICD-10-CM | POA: Diagnosis not present

## 2019-10-16 DIAGNOSIS — N289 Disorder of kidney and ureter, unspecified: Secondary | ICD-10-CM | POA: Diagnosis not present

## 2019-10-16 DIAGNOSIS — R278 Other lack of coordination: Secondary | ICD-10-CM | POA: Diagnosis not present

## 2019-10-16 DIAGNOSIS — F0390 Unspecified dementia without behavioral disturbance: Secondary | ICD-10-CM | POA: Diagnosis not present

## 2019-10-16 DIAGNOSIS — R2681 Unsteadiness on feet: Secondary | ICD-10-CM | POA: Diagnosis not present

## 2019-10-16 DIAGNOSIS — D72829 Elevated white blood cell count, unspecified: Secondary | ICD-10-CM | POA: Diagnosis not present

## 2019-10-16 DIAGNOSIS — R402431 Glasgow coma scale score 3-8, in the field [EMT or ambulance]: Secondary | ICD-10-CM | POA: Diagnosis not present

## 2019-10-16 DIAGNOSIS — Z7401 Bed confinement status: Secondary | ICD-10-CM | POA: Diagnosis not present

## 2019-10-16 DIAGNOSIS — M6281 Muscle weakness (generalized): Secondary | ICD-10-CM | POA: Diagnosis not present

## 2019-10-16 DIAGNOSIS — M2550 Pain in unspecified joint: Secondary | ICD-10-CM | POA: Diagnosis not present

## 2019-10-16 DIAGNOSIS — N179 Acute kidney failure, unspecified: Secondary | ICD-10-CM | POA: Diagnosis not present

## 2019-10-16 DIAGNOSIS — R0902 Hypoxemia: Secondary | ICD-10-CM | POA: Diagnosis not present

## 2019-10-16 LAB — GLUCOSE, CAPILLARY
Glucose-Capillary: 106 mg/dL — ABNORMAL HIGH (ref 70–99)
Glucose-Capillary: 121 mg/dL — ABNORMAL HIGH (ref 70–99)
Glucose-Capillary: 122 mg/dL — ABNORMAL HIGH (ref 70–99)
Glucose-Capillary: 123 mg/dL — ABNORMAL HIGH (ref 70–99)

## 2019-10-16 MED ORDER — TAMSULOSIN HCL 0.4 MG PO CAPS: 0.4000 mg | ORAL_CAPSULE | Freq: Every day | ORAL | Status: AC

## 2019-10-16 MED ORDER — POTASSIUM CHLORIDE CRYS ER 20 MEQ PO TBCR: 20.0000 meq | EXTENDED_RELEASE_TABLET | Freq: Every day | ORAL | Status: AC

## 2019-10-16 MED ORDER — POLYETHYLENE GLYCOL 3350 17 G PO PACK: 17.0000 g | PACK | Freq: Every day | ORAL | 0 refills | Status: AC

## 2019-10-16 MED ORDER — BISACODYL 10 MG RE SUPP
10.0000 mg | Freq: Once | RECTAL | Status: AC
  Administered 2019-10-16: 10 mg via RECTAL
  Filled 2019-10-16: qty 1

## 2019-10-16 MED ORDER — AMOXICILLIN-POT CLAVULANATE 875-125 MG PO TABS: 1.0000 | ORAL_TABLET | Freq: Two times a day (BID) | ORAL | 0 refills | Status: AC

## 2019-10-16 MED ORDER — POLYETHYLENE GLYCOL 3350 17 G PO PACK
17.0000 g | PACK | Freq: Every day | ORAL | Status: DC
  Administered 2019-10-16: 17 g via ORAL
  Filled 2019-10-16: qty 1

## 2019-10-16 MED ORDER — FAMOTIDINE 20 MG PO TABS: 20.0000 mg | ORAL_TABLET | Freq: Every day | ORAL | Status: AC

## 2019-10-16 NOTE — TOC Transition Note (Signed)
Transition of Care [facility]) - CM/SW Discharge Note   Patient Details  Name: DAVANTA JUERGENSEN MRN: [id number] Date of Birth: 05/04/1931  Transition [facility] Care [facility]) CM/SW Contact:  Sharin Mons, RN Phone Number: 10/16/2019, 11:08 AM   Clinical Narrative:    Patient will DC to: [facility] Anticipated DC date:10/16/2019 Family notified: [name] (spouse) / Maudie Mercury ( daughter) Transport by: Corey Harold   Per MD patient ready for DC to . RN, patient, patient's family, and facility notified of DC. Discharge Summary and FL2 sent to facility. RN to call report prior to discharge [phone] ). Rm# [id number]. DC packet on chart. Ambulance transport requested for patient.   Caryn Bee (Daughter)  [name]   [phone]     RNCM will sign off for now as intervention is no longer needed. Please consult [place] again if new needs arise.    Final next level of care: Skilled Nursing Facility Barriers to Discharge: No Barriers Identified   Patient Goals and CMS Choice        Discharge Placement                       Discharge Plan and Services In-house Referral: Clinical Social Work                                   Social Determinants of Health (SDOH) Interventions     Readmission Risk Interventions No flowsheet data found.

## 2019-10-16 NOTE — Discharge Summary (Signed)
Physician Discharge Summary  Derrick Ross [id number] DOB: [birth year] DOA: 10/11/2019  PCP: Wenda Low, MD  Admit date: 10/11/2019 Discharge date: 10/16/2019  Admitted From: Home Disposition:  SNF  Discharge Condition:Stable CODE STATUS:FULL Diet recommendation: Heart Healthy  Brief/Interim Summary:  Patient is a 86 year old male with history of CVA, dementia, hypertension, diabetes type 2, A. fib on Eliquis who presents to the emergency department with complaints of generalized weakness, frequent urination.  Patient was found to be more weak in general for last few days.  On presentation, he was found to be febrile, hypotensive.  Chest x-ray did not show any pneumonia.  Found to have leukocytosis, lactic acidosis.  Sepsis suspected and started on broad spectrum antibiotics.  He remains hemodynamically stable.  Antibiotics has been changed to oral.  PT/OT recommended skilled nursing facility.   Following Problems were addressed during his hospitalization:  Sepsis: Presented with generalized weakness, fever, hypotension, leukocytosis, lactic acidosis, AKI.  Chest x-ray did not show pneumonia.  Urinalysis was not impressive for UTI.  No abdominal tenderness.  Cultures  sent,NGTD.  Started on broad start antibiotics ,now descalated  to oral antibiotics Blood pressure is better now.  Leukocytosis has improved.  Patient is afebrile currently  Permanent A. fib: Currently rate is well controlled. On Eliquis.    AKI vs CkD stage 2: No recent labs to compare.  Started on gentle IV fluids with improvement in the kidney function.  IV fluids discontinued.  Diabetes type 2: On oral agents at home. Hemoglobin A1c of 6.5  Hypertension: Takes multiple medications at home.  Blood pressure was soft on presentation.  Most of the medications have been discontinued except amlodipine.  Dementia: Pretty advanced.  Confused at baseline.  Continue supportive care.  He has history of CVA.  Large  left inguinal hernia: Likely incarcerated.Chronic. Has been there for several years. Follow-up with general surgery as an outpatient.  Hypokalemia/Hypomagnesemia: Supplemented   Urinary retention:resolved.Started on flomax.  Debilty/deconditioning: Walks with cane/walker. PT/OT evaluation, recommend skilled nursing facility  Discharge Diagnoses:  Principal Problem:   Sepsis ([place]) Active Problems:   Atrial fibrillation ([place])   History of hypertension   History of ischemic stroke   DM (diabetes mellitus) ([place])   Dementia ([place])   Renal insufficiency    Discharge Instructions  Discharge Instructions    Diet - low sodium heart healthy   Complete by: As directed    Discharge instructions   Complete by: As directed    1)Please take prescribed medications as instructed. 2)Do a CBC and BMP tests in a week.   Increase activity slowly   Complete by: As directed      Allergies as of 10/16/2019   No Known Allergies     Medication List    STOP taking these medications   atenolol 100 MG tablet Commonly known as: TENORMIN   benazepril 40 MG tablet Commonly known as: LOTENSIN   cloNIDine 0.2 MG tablet Commonly known as: CATAPRES   hydrochlorothiazide 25 MG tablet Commonly known as: HYDRODIURIL   ketoconazole 2 % cream Commonly known as: NIZORAL     TAKE these medications   acetaminophen 500 MG tablet Commonly known as: TYLENOL Take 500 mg by mouth every 6 (six) hours as needed.   amLODipine 10 MG tablet Commonly known as: NORVASC Take 10 mg by mouth daily.   amoxicillin-clavulanate 875-125 MG tablet Commonly known as: AUGMENTIN Take 1 tablet by mouth every 12 (twelve) hours for 3 days.   apixaban 5 MG Tabs  tablet Commonly known as: ELIQUIS Take 1 tablet (5 mg total) by mouth 2 (two) times daily. Take first dose at 10pm tonight and then 8:00 and 8:00   CO Q10 PO Take 1 capsule by mouth daily.   donepezil 5 MG tablet Commonly known as: ARICEPT Take 5 mg  by mouth at bedtime.   famotidine 20 MG tablet Commonly known as: PEPCID Take 1 tablet (20 mg total) by mouth daily. Start taking on: October 17, 2019   M-VIT PO Take 1 tablet by mouth daily.   memantine 10 MG tablet Commonly known as: NAMENDA Take 10 mg by mouth 2 (two) times daily.   metFORMIN 500 MG tablet Commonly known as: GLUCOPHAGE Take 500 mg by mouth 2 (two) times daily with a meal.   polyethylene glycol 17 g packet Commonly known as: MIRALAX / GLYCOLAX Take 17 g by mouth daily.   potassium chloride SA 20 MEQ tablet Commonly known as: KLOR-CON Take 1 tablet (20 mEq total) by mouth daily. Start taking on: October 17, 2019 What changed: when to take this   simvastatin 20 MG tablet Commonly known as: ZOCOR Take 20 mg by mouth daily.   tamsulosin 0.4 MG Caps capsule Commonly known as: FLOMAX Take 1 capsule (0.4 mg total) by mouth daily. Start taking on: October 17, 2019       No Known Allergies  Consultations:  None   Procedures/Studies: DG Chest Portable 1 View  Result Date: 10/11/2019 CLINICAL DATA:  86 year old male with fever and altered mental status. EXAM: PORTABLE CHEST 1 VIEW COMPARISON:  None. FINDINGS: Portable AP semi upright view at 2317 hours. Low lung volumes. Cardiac size at the upper limits of normal. Other mediastinal contours are within normal limits. Visualized tracheal air column is within normal limits. Allowing for portable technique the lungs are clear. No pneumothorax. No acute osseous abnormality identified. IMPRESSION: No acute cardiopulmonary abnormality. Low lung volumes and cardiac size at the upper limits of normal. Electronically Signed   By: Genevie Ann M.D.   On: 10/11/2019 23:29       Subjective: Patient seen and examined at  the bedside this morning.  Hemodynamically stable for discharge today.  Discharge Exam: Vitals:   10/16/19 0500 10/16/19 0805  BP: 112/72 (!) 147/91  Pulse: 82 78  Resp: 18 17  Temp: 98.2 F (36.8 C)  100.1 F (37.8 C)  SpO2: 95% 95%   Vitals:   10/15/19 1406 10/15/19 1940 10/16/19 0500 10/16/19 0805  BP: 133/76 (!) 148/82 112/72 (!) 147/91  Pulse: 80 96 82 78  Resp: 17 20 18 17   Temp: 98.9 F (37.2 C) 97.8 F (36.6 C) 98.2 F (36.8 C) 100.1 F (37.8 C)  TempSrc: Axillary Oral Oral Oral  SpO2: 96% 97% 95% 95%  Weight:      Height:        General: Comfortable Cardiovascular: Afib, no rubs, no gallops Respiratory: CTA bilaterally, no wheezing, no rhonchi Abdominal: Soft, NT, ND, bowel sounds +,large left inguinal hernia Extremities: no edema, no cyanosis    The results of significant diagnostics from this hospitalization (including imaging, microbiology, ancillary and laboratory) are listed below for reference.     Microbiology: Recent Results (from the past 240 hour(s))  Blood culture (routine x 2)     Status: None (Preliminary result)   Collection Time: 10/11/19 11:20 PM   Specimen: BLOOD LEFT WRIST  Result Value Ref Range Status   Specimen Description BLOOD LEFT WRIST  Final   Special  Requests   Final    BOTTLES DRAWN AEROBIC AND ANAEROBIC Blood Culture adequate volume   Culture   Final    NO GROWTH 3 DAYS Performed at [facility], [place] [address] [place], [place] [address]    Report Status PENDING  Incomplete  Blood culture (routine x 2)     Status: None (Preliminary result)   Collection Time: 10/11/19 11:25 PM   Specimen: BLOOD  Result Value Ref Range Status   Specimen Description BLOOD LEFT ANTECUBITAL  Final   Special Requests   Final    BOTTLES DRAWN AEROBIC AND ANAEROBIC Blood Culture adequate volume   Culture   Final    NO GROWTH 3 DAYS Performed at [facility], [place] [address] [place], [place] [address]    Report Status PENDING  Incomplete  SARS CORONAVIRUS 2 (TAT 6-24 HRS) Nasopharyngeal Nasopharyngeal Swab     Status: None   Collection Time: 10/12/19  1:12 AM   Specimen: Nasopharyngeal Swab  Result Value Ref Range Status   SARS  Coronavirus 2 NEGATIVE NEGATIVE Final    Comment: (NOTE) SARS-CoV-2 target nucleic acids are NOT DETECTED. The SARS-CoV-2 RNA is generally detectable in upper and lower respiratory specimens during the acute phase of infection. Negative results do not preclude SARS-CoV-2 infection, do not rule out co-infections with other pathogens, and should not be used as the sole basis for treatment or other patient management decisions. Negative results must be combined with clinical observations, patient history, and epidemiological information. The expected result is Negative. Fact Sheet for Patients: SugarRoll.be Fact Sheet for Healthcare Providers: https://www.woods-mathews.com/ This test is not yet approved or cleared by the [place] FDA and  has been authorized for detection and/or diagnosis of SARS-CoV-2 by FDA under an Emergency Use Authorization (EUA). This EUA will remain  in effect (meaning this test can be used) for the duration of the COVID-19 declaration under Section 56 4(b)(1) of the Act, 21 U.S.C. section 360bbb-3(b)(1), unless the authorization is terminated or revoked sooner. Performed at [facility], [place] [address], [place], [place] [address]   SARS CORONAVIRUS 2 (TAT 6-24 HRS) Nasopharyngeal Nasopharyngeal Swab     Status: None   Collection Time: 10/15/19 12:35 PM   Specimen: Nasopharyngeal Swab  Result Value Ref Range Status   SARS Coronavirus 2 NEGATIVE NEGATIVE Final    Comment: (NOTE) SARS-CoV-2 target nucleic acids are NOT DETECTED. The SARS-CoV-2 RNA is generally detectable in upper and lower respiratory specimens during the acute phase of infection. Negative results do not preclude SARS-CoV-2 infection, do not rule out co-infections with other pathogens, and should not be used as the sole basis for treatment or other patient management decisions. Negative results must be combined with clinical  observations, patient history, and epidemiological information. The expected result is Negative. Fact Sheet for Patients: SugarRoll.be Fact Sheet for Healthcare Providers: https://www.woods-mathews.com/ This test is not yet approved or cleared by the [place] FDA and  has been authorized for detection and/or diagnosis of SARS-CoV-2 by FDA under an Emergency Use Authorization (EUA). This EUA will remain  in effect (meaning this test can be used) for the duration of the COVID-19 declaration under Section 56 4(b)(1) of the Act, 21 U.S.C. section 360bbb-3(b)(1), unless the authorization is terminated or revoked sooner. Performed at [facility], [place] [address], [place], [place] [address]      Labs: BNP (last 3 results) No results for input(s): BNP in the last 8760 hours. Basic  Metabolic Panel: Recent Labs  Lab 10/11/19 2325 10/12/19 0340 10/12/19 0722 10/13/19 0436 10/13/19 1425 10/14/19 0307  NA 140 141  --  138  --  137  K 4.2 4.3  --  3.4*  --  3.6  CL 103 106  --  105  --  101  CO2 21* 22  --  20*  --  21*  GLUCOSE 153* 134*  --  119*  --  123*  BUN 19 22  --  23  --  23  CREATININE 1.45* 1.34*  --  1.13  --  1.11  CALCIUM 9.6 9.1  --  8.9  --  8.8*  MG  --   --  1.6* 1.7 1.7  --    Liver Function Tests: Recent Labs  Lab 10/11/19 2325 10/12/19 0340  AST 20 16  ALT 15 15  ALKPHOS 72 61  BILITOT 1.5* 1.8*  PROT 6.6 5.8*  ALBUMIN 4.0 3.4*   No results for input(s): LIPASE, AMYLASE in the last 168 hours. No results for input(s): AMMONIA in the last 168 hours. CBC: Recent Labs  Lab 10/11/19 2325 10/12/19 0340 10/13/19 0436  WBC 21.9* 19.8* 11.8*  NEUTROABS 19.8* 17.3* 9.5*  HGB 14.2 13.0 13.4  HCT 42.8 39.8 40.0  MCV 90.9 92.1 88.9  PLT 221 179 175   Cardiac Enzymes: No results for input(s): CKTOTAL, CKMB, CKMBINDEX, TROPONINI in the last 168 hours. BNP: Invalid input(s): POCBNP CBG: Recent Labs   Lab 10/15/19 1632 10/15/19 1943 10/15/19 2342 10/16/19 0345 10/16/19 0804  GLUCAP 151* 137* 106* 121* 123*   D-Dimer No results for input(s): DDIMER in the last 72 hours. Hgb A1c No results for input(s): HGBA1C in the last 72 hours. Lipid Profile No results for input(s): CHOL, HDL, LDLCALC, TRIG, CHOLHDL, LDLDIRECT in the last 72 hours. Thyroid function studies No results for input(s): TSH, T4TOTAL, T3FREE, THYROIDAB in the last 72 hours.  Invalid input(s): FREET3 Anemia work up No results for input(s): VITAMINB12, FOLATE, FERRITIN, TIBC, IRON, RETICCTPCT in the last 72 hours. Urinalysis    Component Value Date/Time   COLORURINE YELLOW 10/12/2019 0001   APPEARANCEUR HAZY (A) 10/12/2019 0001   LABSPEC 1.014 10/12/2019 0001   PHURINE 7.0 10/12/2019 0001   GLUCOSEU NEGATIVE 10/12/2019 0001   HGBUR LARGE (A) 10/12/2019 0001   BILIRUBINUR NEGATIVE 10/12/2019 0001   KETONESUR NEGATIVE 10/12/2019 0001   PROTEINUR 30 (A) 10/12/2019 0001   UROBILINOGEN 1.0 09/26/2014 1527   NITRITE NEGATIVE 10/12/2019 0001   LEUKOCYTESUR NEGATIVE 10/12/2019 0001   Sepsis Labs Invalid input(s): PROCALCITONIN,  WBC,  LACTICIDVEN Microbiology Recent Results (from the past 240 hour(s))  Blood culture (routine x 2)     Status: None (Preliminary result)   Collection Time: 10/11/19 11:20 PM   Specimen: BLOOD LEFT WRIST  Result Value Ref Range Status   Specimen Description BLOOD LEFT WRIST  Final   Special Requests   Final    BOTTLES DRAWN AEROBIC AND ANAEROBIC Blood Culture adequate volume   Culture   Final    NO GROWTH 3 DAYS Performed at [facility], [address] [place], [place] [address]    Report Status PENDING  Incomplete  Blood culture (routine x 2)     Status: None (Preliminary result)   Collection Time: 10/11/19 11:25 PM   Specimen: BLOOD  Result Value Ref Range Status   Specimen Description BLOOD LEFT ANTECUBITAL  Final   Special Requests   Final    BOTTLES  DRAWN  AEROBIC AND ANAEROBIC Blood Culture adequate volume   Culture   Final    NO GROWTH 3 DAYS Performed at [facility], [place] [address] [place], [place] [address]    Report Status PENDING  Incomplete  SARS CORONAVIRUS 2 (TAT 6-24 HRS) Nasopharyngeal Nasopharyngeal Swab     Status: None   Collection Time: 10/12/19  1:12 AM   Specimen: Nasopharyngeal Swab  Result Value Ref Range Status   SARS Coronavirus 2 NEGATIVE NEGATIVE Final    Comment: (NOTE) SARS-CoV-2 target nucleic acids are NOT DETECTED. The SARS-CoV-2 RNA is generally detectable in upper and lower respiratory specimens during the acute phase of infection. Negative results do not preclude SARS-CoV-2 infection, do not rule out co-infections with other pathogens, and should not be used as the sole basis for treatment or other patient management decisions. Negative results must be combined with clinical observations, patient history, and epidemiological information. The expected result is Negative. Fact Sheet for Patients: SugarRoll.be Fact Sheet for Healthcare Providers: https://www.woods-mathews.com/ This test is not yet approved or cleared by the [place] FDA and  has been authorized for detection and/or diagnosis of SARS-CoV-2 by FDA under an Emergency Use Authorization (EUA). This EUA will remain  in effect (meaning this test can be used) for the duration of the COVID-19 declaration under Section 56 4(b)(1) of the Act, 21 U.S.C. section 360bbb-3(b)(1), unless the authorization is terminated or revoked sooner. Performed at [facility], [place] [address], [place], [place] [address]   SARS CORONAVIRUS 2 (TAT 6-24 HRS) Nasopharyngeal Nasopharyngeal Swab     Status: None   Collection Time: 10/15/19 12:35 PM   Specimen: Nasopharyngeal Swab  Result Value Ref Range Status   SARS Coronavirus 2 NEGATIVE NEGATIVE Final    Comment: (NOTE) SARS-CoV-2 target nucleic acids are  NOT DETECTED. The SARS-CoV-2 RNA is generally detectable in upper and lower respiratory specimens during the acute phase of infection. Negative results do not preclude SARS-CoV-2 infection, do not rule out co-infections with other pathogens, and should not be used as the sole basis for treatment or other patient management decisions. Negative results must be combined with clinical observations, patient history, and epidemiological information. The expected result is Negative. Fact Sheet for Patients: SugarRoll.be Fact Sheet for Healthcare Providers: https://www.woods-mathews.com/ This test is not yet approved or cleared by the [place] FDA and  has been authorized for detection and/or diagnosis of SARS-CoV-2 by FDA under an Emergency Use Authorization (EUA). This EUA will remain  in effect (meaning this test can be used) for the duration of the COVID-19 declaration under Section 56 4(b)(1) of the Act, 21 U.S.C. section 360bbb-3(b)(1), unless the authorization is terminated or revoked sooner. Performed at [facility], [place] [address], [place], [place] [address]     Please note: You were cared for by a hospitalist during your hospital stay. Once you are discharged, your primary care physician will handle any further medical issues. Please note that NO REFILLS for any discharge medications will be authorized once you are discharged, as it is imperative that you return to your primary care physician (or establish a relationship with a primary care physician if you do not have one) for your post hospital discharge needs so that they can reassess your need for medications and monitor your lab values.    Time coordinating discharge: 40 minutes  SIGNED:   Shelly Coss, MD  [facility] 10/16/2019, 10:44 AM Pager [phone]  If 7PM-7AM, please contact  night-coverage [email or web address] Password [email or web address]

## 2019-10-16 NOTE — Progress Notes (Signed)
Report called to Nunzio Cory , nurse at [facility]. Pt is already there, left [employer] around 12:20. Just now was able to get a hold of someone from the facility.

## 2019-10-16 NOTE — Plan of Care (Signed)
  Problem: Clinical Measurements: Goal: Respiratory complications will improve Outcome: Progressing Note: On room air now tolerating well   Problem: Nutrition: Goal: Adequate nutrition will be maintained Outcome: Progressing   Problem: Coping: Goal: Level of anxiety will decrease Outcome: Progressing   Problem: Elimination: Goal: Will not experience complications related to urinary retention Outcome: Progressing   Problem: Pain Managment: Goal: General experience of comfort will improve Outcome: Progressing Note: No complaints of pain noted or verbalized   Problem: Safety: Goal: Ability to remain free from injury will improve Outcome: Progressing

## 2019-10-17 DIAGNOSIS — A419 Sepsis, unspecified organism: Secondary | ICD-10-CM | POA: Diagnosis not present

## 2019-10-17 DIAGNOSIS — G309 Alzheimer's disease, unspecified: Secondary | ICD-10-CM | POA: Diagnosis not present

## 2019-10-17 DIAGNOSIS — N179 Acute kidney failure, unspecified: Secondary | ICD-10-CM | POA: Diagnosis not present

## 2019-10-17 DIAGNOSIS — N390 Urinary tract infection, site not specified: Secondary | ICD-10-CM | POA: Diagnosis not present

## 2019-10-17 LAB — CULTURE, BLOOD (ROUTINE X 2)
Culture: NO GROWTH
Culture: NO GROWTH
Special Requests: ADEQUATE
Special Requests: ADEQUATE

## 2019-11-01 DIAGNOSIS — N183 Chronic kidney disease, stage 3 unspecified: Secondary | ICD-10-CM | POA: Diagnosis not present

## 2019-11-01 DIAGNOSIS — I10 Essential (primary) hypertension: Secondary | ICD-10-CM | POA: Diagnosis not present

## 2019-11-01 DIAGNOSIS — G309 Alzheimer's disease, unspecified: Secondary | ICD-10-CM | POA: Diagnosis not present

## 2019-11-01 DIAGNOSIS — I4891 Unspecified atrial fibrillation: Secondary | ICD-10-CM | POA: Diagnosis not present

## 2019-11-05 DIAGNOSIS — F339 Major depressive disorder, recurrent, unspecified: Secondary | ICD-10-CM | POA: Diagnosis not present

## 2019-11-05 DIAGNOSIS — F0390 Unspecified dementia without behavioral disturbance: Secondary | ICD-10-CM | POA: Diagnosis not present

## 2019-11-06 DIAGNOSIS — E1122 Type 2 diabetes mellitus with diabetic chronic kidney disease: Secondary | ICD-10-CM | POA: Diagnosis not present

## 2019-11-06 DIAGNOSIS — A419 Sepsis, unspecified organism: Secondary | ICD-10-CM | POA: Diagnosis not present

## 2019-11-06 DIAGNOSIS — F0281 Dementia in other diseases classified elsewhere with behavioral disturbance: Secondary | ICD-10-CM | POA: Diagnosis not present

## 2019-11-06 DIAGNOSIS — I10 Essential (primary) hypertension: Secondary | ICD-10-CM | POA: Diagnosis not present

## 2019-11-06 DIAGNOSIS — Z20828 Contact with and (suspected) exposure to other viral communicable diseases: Secondary | ICD-10-CM | POA: Diagnosis not present

## 2019-11-12 DIAGNOSIS — Z20828 Contact with and (suspected) exposure to other viral communicable diseases: Secondary | ICD-10-CM | POA: Diagnosis not present

## 2019-11-17 DIAGNOSIS — Z03818 Encounter for observation for suspected exposure to other biological agents ruled out: Secondary | ICD-10-CM | POA: Diagnosis not present

## 2019-11-26 DIAGNOSIS — Z20828 Contact with and (suspected) exposure to other viral communicable diseases: Secondary | ICD-10-CM | POA: Diagnosis not present

## 2019-11-29 DIAGNOSIS — G309 Alzheimer's disease, unspecified: Secondary | ICD-10-CM | POA: Diagnosis not present

## 2019-11-29 DIAGNOSIS — I4891 Unspecified atrial fibrillation: Secondary | ICD-10-CM | POA: Diagnosis not present

## 2019-11-29 DIAGNOSIS — N183 Chronic kidney disease, stage 3 unspecified: Secondary | ICD-10-CM | POA: Diagnosis not present

## 2019-11-29 DIAGNOSIS — I10 Essential (primary) hypertension: Secondary | ICD-10-CM | POA: Diagnosis not present

## 2019-12-12 DIAGNOSIS — I10 Essential (primary) hypertension: Secondary | ICD-10-CM | POA: Diagnosis not present

## 2019-12-12 DIAGNOSIS — F0281 Dementia in other diseases classified elsewhere with behavioral disturbance: Secondary | ICD-10-CM | POA: Diagnosis not present

## 2019-12-12 DIAGNOSIS — M6281 Muscle weakness (generalized): Secondary | ICD-10-CM | POA: Diagnosis not present

## 2019-12-12 DIAGNOSIS — I482 Chronic atrial fibrillation, unspecified: Secondary | ICD-10-CM | POA: Diagnosis not present

## 2019-12-18 DIAGNOSIS — F0281 Dementia in other diseases classified elsewhere with behavioral disturbance: Secondary | ICD-10-CM | POA: Diagnosis not present

## 2019-12-18 DIAGNOSIS — M6281 Muscle weakness (generalized): Secondary | ICD-10-CM | POA: Diagnosis not present

## 2019-12-18 DIAGNOSIS — E1122 Type 2 diabetes mellitus with diabetic chronic kidney disease: Secondary | ICD-10-CM | POA: Diagnosis not present

## 2019-12-18 DIAGNOSIS — I10 Essential (primary) hypertension: Secondary | ICD-10-CM | POA: Diagnosis not present

## 2019-12-21 DIAGNOSIS — Z03818 Encounter for observation for suspected exposure to other biological agents ruled out: Secondary | ICD-10-CM | POA: Diagnosis not present

## 2019-12-25 DIAGNOSIS — Z20828 Contact with and (suspected) exposure to other viral communicable diseases: Secondary | ICD-10-CM | POA: Diagnosis not present

## 2020-01-03 DIAGNOSIS — G309 Alzheimer's disease, unspecified: Secondary | ICD-10-CM | POA: Diagnosis not present

## 2020-01-03 DIAGNOSIS — N183 Chronic kidney disease, stage 3 unspecified: Secondary | ICD-10-CM | POA: Diagnosis not present

## 2020-01-03 DIAGNOSIS — I10 Essential (primary) hypertension: Secondary | ICD-10-CM | POA: Diagnosis not present

## 2020-01-03 DIAGNOSIS — I4891 Unspecified atrial fibrillation: Secondary | ICD-10-CM | POA: Diagnosis not present

## 2020-02-12 DIAGNOSIS — Z03818 Encounter for observation for suspected exposure to other biological agents ruled out: Secondary | ICD-10-CM | POA: Diagnosis not present

## 2020-02-13 DIAGNOSIS — E1122 Type 2 diabetes mellitus with diabetic chronic kidney disease: Secondary | ICD-10-CM | POA: Diagnosis not present

## 2020-02-13 DIAGNOSIS — I10 Essential (primary) hypertension: Secondary | ICD-10-CM | POA: Diagnosis not present

## 2020-02-13 DIAGNOSIS — R627 Adult failure to thrive: Secondary | ICD-10-CM | POA: Diagnosis not present

## 2020-02-13 DIAGNOSIS — L8990 Pressure ulcer of unspecified site, unspecified stage: Secondary | ICD-10-CM | POA: Diagnosis not present

## 2020-02-17 DIAGNOSIS — Z03818 Encounter for observation for suspected exposure to other biological agents ruled out: Secondary | ICD-10-CM | POA: Diagnosis not present

## 2020-02-18 DIAGNOSIS — F0390 Unspecified dementia without behavioral disturbance: Secondary | ICD-10-CM | POA: Diagnosis not present

## 2020-02-18 DIAGNOSIS — F339 Major depressive disorder, recurrent, unspecified: Secondary | ICD-10-CM | POA: Diagnosis not present

## 2020-02-20 DIAGNOSIS — Z20822 Contact with and (suspected) exposure to covid-19: Secondary | ICD-10-CM | POA: Diagnosis not present

## 2020-02-21 DIAGNOSIS — N183 Chronic kidney disease, stage 3 unspecified: Secondary | ICD-10-CM | POA: Diagnosis not present

## 2020-02-21 DIAGNOSIS — I4891 Unspecified atrial fibrillation: Secondary | ICD-10-CM | POA: Diagnosis not present

## 2020-02-21 DIAGNOSIS — I10 Essential (primary) hypertension: Secondary | ICD-10-CM | POA: Diagnosis not present

## 2020-02-21 DIAGNOSIS — G309 Alzheimer's disease, unspecified: Secondary | ICD-10-CM | POA: Diagnosis not present

## 2020-02-24 DIAGNOSIS — Z20822 Contact with and (suspected) exposure to covid-19: Secondary | ICD-10-CM | POA: Diagnosis not present

## 2020-02-27 DIAGNOSIS — Z20822 Contact with and (suspected) exposure to covid-19: Secondary | ICD-10-CM | POA: Diagnosis not present

## 2020-03-02 DIAGNOSIS — Z20822 Contact with and (suspected) exposure to covid-19: Secondary | ICD-10-CM | POA: Diagnosis not present

## 2020-03-05 DIAGNOSIS — I482 Chronic atrial fibrillation, unspecified: Secondary | ICD-10-CM | POA: Diagnosis not present

## 2020-03-05 DIAGNOSIS — F0281 Dementia in other diseases classified elsewhere with behavioral disturbance: Secondary | ICD-10-CM | POA: Diagnosis not present

## 2020-03-05 DIAGNOSIS — E1122 Type 2 diabetes mellitus with diabetic chronic kidney disease: Secondary | ICD-10-CM | POA: Diagnosis not present

## 2020-03-05 DIAGNOSIS — I10 Essential (primary) hypertension: Secondary | ICD-10-CM | POA: Diagnosis not present

## 2020-03-09 DIAGNOSIS — Z20822 Contact with and (suspected) exposure to covid-19: Secondary | ICD-10-CM | POA: Diagnosis not present

## 2020-03-16 DIAGNOSIS — Z20828 Contact with and (suspected) exposure to other viral communicable diseases: Secondary | ICD-10-CM | POA: Diagnosis not present

## 2020-03-24 DIAGNOSIS — F339 Major depressive disorder, recurrent, unspecified: Secondary | ICD-10-CM | POA: Diagnosis not present

## 2020-03-24 DIAGNOSIS — F0390 Unspecified dementia without behavioral disturbance: Secondary | ICD-10-CM | POA: Diagnosis not present

## 2020-03-27 DIAGNOSIS — I4891 Unspecified atrial fibrillation: Secondary | ICD-10-CM | POA: Diagnosis not present

## 2020-03-27 DIAGNOSIS — I10 Essential (primary) hypertension: Secondary | ICD-10-CM | POA: Diagnosis not present

## 2020-03-27 DIAGNOSIS — N183 Chronic kidney disease, stage 3 unspecified: Secondary | ICD-10-CM | POA: Diagnosis not present

## 2020-03-27 DIAGNOSIS — G309 Alzheimer's disease, unspecified: Secondary | ICD-10-CM | POA: Diagnosis not present

## 2020-04-01 DIAGNOSIS — E1122 Type 2 diabetes mellitus with diabetic chronic kidney disease: Secondary | ICD-10-CM | POA: Diagnosis not present

## 2020-04-01 DIAGNOSIS — I482 Chronic atrial fibrillation, unspecified: Secondary | ICD-10-CM | POA: Diagnosis not present

## 2020-04-01 DIAGNOSIS — F0281 Dementia in other diseases classified elsewhere with behavioral disturbance: Secondary | ICD-10-CM | POA: Diagnosis not present

## 2020-04-01 DIAGNOSIS — I10 Essential (primary) hypertension: Secondary | ICD-10-CM | POA: Diagnosis not present

## 2020-04-02 DIAGNOSIS — F339 Major depressive disorder, recurrent, unspecified: Secondary | ICD-10-CM | POA: Diagnosis not present

## 2020-04-12 DIAGNOSIS — Z20828 Contact with and (suspected) exposure to other viral communicable diseases: Secondary | ICD-10-CM | POA: Diagnosis not present

## 2020-04-15 DIAGNOSIS — Z20828 Contact with and (suspected) exposure to other viral communicable diseases: Secondary | ICD-10-CM | POA: Diagnosis not present

## 2020-04-20 DIAGNOSIS — Z20828 Contact with and (suspected) exposure to other viral communicable diseases: Secondary | ICD-10-CM | POA: Diagnosis not present

## 2020-04-24 DIAGNOSIS — G309 Alzheimer's disease, unspecified: Secondary | ICD-10-CM | POA: Diagnosis not present

## 2020-04-24 DIAGNOSIS — N183 Chronic kidney disease, stage 3 unspecified: Secondary | ICD-10-CM | POA: Diagnosis not present

## 2020-04-24 DIAGNOSIS — I4891 Unspecified atrial fibrillation: Secondary | ICD-10-CM | POA: Diagnosis not present

## 2020-04-24 DIAGNOSIS — I10 Essential (primary) hypertension: Secondary | ICD-10-CM | POA: Diagnosis not present

## 2020-04-30 DIAGNOSIS — Z20828 Contact with and (suspected) exposure to other viral communicable diseases: Secondary | ICD-10-CM | POA: Diagnosis not present

## 2020-05-11 DIAGNOSIS — Z20828 Contact with and (suspected) exposure to other viral communicable diseases: Secondary | ICD-10-CM | POA: Diagnosis not present

## 2020-05-22 DIAGNOSIS — I10 Essential (primary) hypertension: Secondary | ICD-10-CM | POA: Diagnosis not present

## 2020-05-22 DIAGNOSIS — N183 Chronic kidney disease, stage 3 unspecified: Secondary | ICD-10-CM | POA: Diagnosis not present

## 2020-05-22 DIAGNOSIS — G309 Alzheimer's disease, unspecified: Secondary | ICD-10-CM | POA: Diagnosis not present

## 2020-05-22 DIAGNOSIS — I4891 Unspecified atrial fibrillation: Secondary | ICD-10-CM | POA: Diagnosis not present

## 2020-06-04 DIAGNOSIS — S80819A Abrasion, unspecified lower leg, initial encounter: Secondary | ICD-10-CM | POA: Diagnosis not present

## 2020-06-04 DIAGNOSIS — L97529 Non-pressure chronic ulcer of other part of left foot with unspecified severity: Secondary | ICD-10-CM | POA: Diagnosis not present

## 2020-06-16 DIAGNOSIS — F339 Major depressive disorder, recurrent, unspecified: Secondary | ICD-10-CM | POA: Diagnosis not present

## 2020-06-16 DIAGNOSIS — F0390 Unspecified dementia without behavioral disturbance: Secondary | ICD-10-CM | POA: Diagnosis not present

## 2020-06-19 DIAGNOSIS — G309 Alzheimer's disease, unspecified: Secondary | ICD-10-CM | POA: Diagnosis not present

## 2020-06-19 DIAGNOSIS — N183 Chronic kidney disease, stage 3 unspecified: Secondary | ICD-10-CM | POA: Diagnosis not present

## 2020-06-19 DIAGNOSIS — I10 Essential (primary) hypertension: Secondary | ICD-10-CM | POA: Diagnosis not present

## 2020-06-19 DIAGNOSIS — I4891 Unspecified atrial fibrillation: Secondary | ICD-10-CM | POA: Diagnosis not present

## 2020-07-07 DIAGNOSIS — F339 Major depressive disorder, recurrent, unspecified: Secondary | ICD-10-CM | POA: Diagnosis not present

## 2020-07-07 DIAGNOSIS — F0390 Unspecified dementia without behavioral disturbance: Secondary | ICD-10-CM | POA: Diagnosis not present

## 2020-07-31 DIAGNOSIS — G309 Alzheimer's disease, unspecified: Secondary | ICD-10-CM | POA: Diagnosis not present

## 2020-07-31 DIAGNOSIS — I4891 Unspecified atrial fibrillation: Secondary | ICD-10-CM | POA: Diagnosis not present

## 2020-07-31 DIAGNOSIS — I10 Essential (primary) hypertension: Secondary | ICD-10-CM | POA: Diagnosis not present

## 2020-07-31 DIAGNOSIS — N183 Chronic kidney disease, stage 3 unspecified: Secondary | ICD-10-CM | POA: Diagnosis not present

## 2020-08-04 DIAGNOSIS — F0390 Unspecified dementia without behavioral disturbance: Secondary | ICD-10-CM | POA: Diagnosis not present

## 2020-08-04 DIAGNOSIS — F339 Major depressive disorder, recurrent, unspecified: Secondary | ICD-10-CM | POA: Diagnosis not present

## 2020-08-16 DIAGNOSIS — U071 COVID-19: Secondary | ICD-10-CM | POA: Diagnosis not present

## 2020-08-16 DIAGNOSIS — I482 Chronic atrial fibrillation, unspecified: Secondary | ICD-10-CM | POA: Diagnosis not present

## 2020-08-16 DIAGNOSIS — F0281 Dementia in other diseases classified elsewhere with behavioral disturbance: Secondary | ICD-10-CM | POA: Diagnosis not present

## 2020-08-16 DIAGNOSIS — M6281 Muscle weakness (generalized): Secondary | ICD-10-CM | POA: Diagnosis not present

## 2020-08-28 DIAGNOSIS — G309 Alzheimer's disease, unspecified: Secondary | ICD-10-CM | POA: Diagnosis not present

## 2020-08-28 DIAGNOSIS — N183 Chronic kidney disease, stage 3 unspecified: Secondary | ICD-10-CM | POA: Diagnosis not present

## 2020-08-28 DIAGNOSIS — I10 Essential (primary) hypertension: Secondary | ICD-10-CM | POA: Diagnosis not present

## 2020-08-28 DIAGNOSIS — I4891 Unspecified atrial fibrillation: Secondary | ICD-10-CM | POA: Diagnosis not present

## 2020-09-25 DIAGNOSIS — G309 Alzheimer's disease, unspecified: Secondary | ICD-10-CM | POA: Diagnosis not present

## 2020-09-25 DIAGNOSIS — I4891 Unspecified atrial fibrillation: Secondary | ICD-10-CM | POA: Diagnosis not present

## 2020-09-25 DIAGNOSIS — N183 Chronic kidney disease, stage 3 unspecified: Secondary | ICD-10-CM | POA: Diagnosis not present

## 2020-09-25 DIAGNOSIS — I10 Essential (primary) hypertension: Secondary | ICD-10-CM | POA: Diagnosis not present

## 2020-10-16 DEATH — deceased
